# Patient Record
Sex: Male | Born: 2005 | Race: Black or African American | Hispanic: No | Marital: Single | Smoking: Never smoker
Health system: Southern US, Community
[De-identification: ages and names within clinical notes are randomized; demographics above are authoritative.]

## PROBLEM LIST (undated history)

## (undated) DIAGNOSIS — J45909 Unspecified asthma, uncomplicated: Secondary | ICD-10-CM

## (undated) DIAGNOSIS — D571 Sickle-cell disease without crisis: Secondary | ICD-10-CM

## (undated) HISTORY — PX: TONSILLECTOMY: SUR1361

## (undated) HISTORY — PX: CHOLECYSTECTOMY: SHX55

## (undated) HISTORY — PX: TYMPANOSTOMY TUBE PLACEMENT: SHX32

---
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
## 2024-01-08 ENCOUNTER — Inpatient Hospital Stay (HOSPITAL_COMMUNITY)
Admission: EM | Admit: 2024-01-08 | Discharge: 2024-01-16 | DRG: 812 | Disposition: A | Discharge status: Home / Self Care | Attending: Internal Medicine | Admitting: Internal Medicine

## 2024-01-08 ENCOUNTER — Other Ambulatory Visit: Payer: Self-pay

## 2024-01-08 DIAGNOSIS — R030 Elevated blood-pressure reading, without diagnosis of hypertension: Secondary | ICD-10-CM

## 2024-01-08 DIAGNOSIS — G894 Chronic pain syndrome: Secondary | ICD-10-CM | POA: Diagnosis present

## 2024-01-08 DIAGNOSIS — Z79899 Other long term (current) drug therapy: Secondary | ICD-10-CM

## 2024-01-08 DIAGNOSIS — H6691 Otitis media, unspecified, right ear: Secondary | ICD-10-CM | POA: Diagnosis present

## 2024-01-08 DIAGNOSIS — I10 Essential (primary) hypertension: Secondary | ICD-10-CM | POA: Diagnosis present

## 2024-01-08 DIAGNOSIS — F17290 Nicotine dependence, other tobacco product, uncomplicated: Secondary | ICD-10-CM | POA: Diagnosis present

## 2024-01-08 DIAGNOSIS — D638 Anemia in other chronic diseases classified elsewhere: Secondary | ICD-10-CM | POA: Diagnosis present

## 2024-01-08 DIAGNOSIS — Z720 Tobacco use: Secondary | ICD-10-CM | POA: Diagnosis present

## 2024-01-08 DIAGNOSIS — D5700 Hb-SS disease with crisis, unspecified: Principal | ICD-10-CM | POA: Diagnosis present

## 2024-01-08 DIAGNOSIS — H6690 Otitis media, unspecified, unspecified ear: Secondary | ICD-10-CM | POA: Insufficient documentation

## 2024-01-08 DIAGNOSIS — Z9049 Acquired absence of other specified parts of digestive tract: Secondary | ICD-10-CM

## 2024-01-08 DIAGNOSIS — Z716 Tobacco abuse counseling: Secondary | ICD-10-CM

## 2024-01-08 LAB — CBC WITH DIFFERENTIAL/PLATELET
Abs Immature Granulocytes: 0.38 K/uL — ABNORMAL HIGH (ref 0.00–0.07)
Basophils Absolute: 0.1 K/uL (ref 0.0–0.1)
Basophils Relative: 1 %
Eosinophils Absolute: 0 K/uL (ref 0.0–0.5)
Eosinophils Relative: 0 %
HCT: 29.1 % — ABNORMAL LOW (ref 39.0–52.0)
Hemoglobin: 10.5 g/dL — ABNORMAL LOW (ref 13.0–17.0)
Immature Granulocytes: 3 %
Lymphocytes Relative: 16 %
Lymphs Abs: 2 K/uL (ref 0.7–4.0)
MCH: 31.3 pg (ref 26.0–34.0)
MCHC: 36.1 g/dL — ABNORMAL HIGH (ref 30.0–36.0)
MCV: 86.9 fL (ref 80.0–100.0)
Monocytes Absolute: 1.2 K/uL — ABNORMAL HIGH (ref 0.1–1.0)
Monocytes Relative: 9 %
Neutro Abs: 9.1 K/uL — ABNORMAL HIGH (ref 1.7–7.7)
Neutrophils Relative %: 71 %
Platelets: 597 K/uL — ABNORMAL HIGH (ref 150–400)
RBC: 3.35 MIL/uL — ABNORMAL LOW (ref 4.22–5.81)
RDW: 19.9 % — ABNORMAL HIGH (ref 11.5–15.5)
WBC: 12.8 K/uL — ABNORMAL HIGH (ref 4.0–10.5)
nRBC: 0.9 % — ABNORMAL HIGH (ref 0.0–0.2)

## 2024-01-08 LAB — COMPREHENSIVE METABOLIC PANEL WITH GFR
ALT: 19 U/L (ref 0–44)
AST: 54 U/L — ABNORMAL HIGH (ref 15–41)
Albumin: 4.7 g/dL (ref 3.5–5.0)
Alkaline Phosphatase: 96 U/L (ref 38–126)
Anion gap: 14 (ref 5–15)
BUN: <5 mg/dL — ABNORMAL LOW (ref 6–20)
CO2: 19 mmol/L — ABNORMAL LOW (ref 22–32)
Calcium: 9.9 mg/dL (ref 8.9–10.3)
Chloride: 104 mmol/L (ref 98–111)
Creatinine, Ser: 0.6 mg/dL — ABNORMAL LOW (ref 0.61–1.24)
GFR, Estimated: >60 mL/min (ref >60)
Glucose, Bld: 112 mg/dL — ABNORMAL HIGH (ref 70–99)
Potassium: 4.3 mmol/L (ref 3.5–5.1)
Sodium: 137 mmol/L (ref 135–145)
Total Bilirubin: 3.5 mg/dL — ABNORMAL HIGH (ref 0.0–1.2)
Total Protein: 7.8 g/dL (ref 6.5–8.1)

## 2024-01-08 LAB — RETICULOCYTES
Immature Retic Fract: 14.8 % (ref 2.3–15.9)
RBC.: 3.6 MIL/uL — ABNORMAL LOW (ref 4.22–5.81)
Retic Count, Absolute: 484 K/uL — ABNORMAL HIGH (ref 19.0–186.0)
Retic Ct Pct: 13.5 % — ABNORMAL HIGH (ref 0.4–3.1)

## 2024-01-08 MED ORDER — ONDANSETRON HCL 4 MG/2ML IJ SOLN: 4.0000 mg | Freq: Four times a day (QID) | INTRAMUSCULAR | Status: DC | PRN

## 2024-01-08 MED ORDER — OXYCODONE HCL 5 MG PO TABS
10.0000 mg | ORAL_TABLET | Freq: Once | ORAL | Status: AC
  Administered 2024-01-08: 10 mg via ORAL
  Filled 2024-01-08: qty 2

## 2024-01-08 MED ORDER — SODIUM CHLORIDE 0.45 % IV SOLN: INTRAVENOUS | Status: DC

## 2024-01-08 MED ORDER — HYDROMORPHONE HCL 1 MG/ML IJ SOLN
1.0000 mg | INTRAMUSCULAR | Status: AC
  Administered 2024-01-08: 1 mg via INTRAVENOUS
  Filled 2024-01-08: qty 1

## 2024-01-08 MED ORDER — KETOROLAC TROMETHAMINE 15 MG/ML IJ SOLN
15.0000 mg | INTRAMUSCULAR | Status: AC
  Administered 2024-01-08: 15 mg via INTRAVENOUS
  Filled 2024-01-08: qty 1

## 2024-01-08 MED ORDER — SODIUM CHLORIDE 0.9% FLUSH: 9.0000 mL | INTRAVENOUS | Status: DC | PRN

## 2024-01-08 MED ORDER — POLYETHYLENE GLYCOL 3350 17 G PO PACK: 17.0000 g | PACK | Freq: Every day | ORAL | Status: DC | PRN

## 2024-01-08 MED ORDER — DIPHENHYDRAMINE HCL 50 MG/ML IJ SOLN: 12.5000 mg | Freq: Four times a day (QID) | INTRAMUSCULAR | Status: DC | PRN

## 2024-01-08 MED ORDER — NALOXONE HCL 0.4 MG/ML IJ SOLN
0.4000 mg | INTRAMUSCULAR | Status: DC | PRN
Start: 2024-01-08 — End: 2024-01-16

## 2024-01-08 MED ORDER — DEXTROSE-SODIUM CHLORIDE 5-0.45 % IV SOLN: INTRAVENOUS | Status: AC

## 2024-01-08 MED ORDER — DIPHENHYDRAMINE HCL 12.5 MG/5ML PO ELIX
12.5000 mg | ORAL_SOLUTION | Freq: Four times a day (QID) | ORAL | Status: DC | PRN
Start: 2024-01-08 — End: 2024-01-16

## 2024-01-08 MED ORDER — SENNOSIDES-DOCUSATE SODIUM 8.6-50 MG PO TABS
1.0000 | ORAL_TABLET | Freq: Two times a day (BID) | ORAL | Status: DC
  Administered 2024-01-08 – 2024-01-16 (×16): 1 via ORAL
  Filled 2024-01-08 (×16): qty 1

## 2024-01-08 MED ORDER — HYDROMORPHONE 1 MG/ML IV SOLN
INTRAVENOUS | Status: DC
  Administered 2024-01-09: 3 mg via INTRAVENOUS
  Administered 2024-01-09: 1.7 mg via INTRAVENOUS
  Administered 2024-01-09: 2.4 mg via INTRAVENOUS
  Administered 2024-01-09: 0.5 mg via INTRAVENOUS
  Administered 2024-01-09: 2.7 mg via INTRAVENOUS
  Administered 2024-01-09: 30 mg via INTRAVENOUS
  Administered 2024-01-09: 2.9 mg via INTRAVENOUS
  Administered 2024-01-10 (×2): 3.6 mg via INTRAVENOUS
  Administered 2024-01-10: 1.8 mg via INTRAVENOUS
  Administered 2024-01-10: 30 mg via INTRAVENOUS
  Administered 2024-01-11: 2.4 mg via INTRAVENOUS
  Administered 2024-01-11: 1.2 mg via INTRAVENOUS
  Administered 2024-01-11: 1.8 mg via INTRAVENOUS
  Administered 2024-01-11: 2.4 mg via INTRAVENOUS
  Administered 2024-01-12: 1.2 mg via INTRAVENOUS
  Administered 2024-01-12: 2.7 mg via INTRAVENOUS
  Administered 2024-01-12: 1.5 mg via INTRAVENOUS
  Administered 2024-01-13: 30 mg via INTRAVENOUS
  Administered 2024-01-14: 1.2 mg via INTRAVENOUS
  Administered 2024-01-14: 0.8 mg via INTRAVENOUS
  Administered 2024-01-14: 1.2 mg via INTRAVENOUS
  Administered 2024-01-14: 1.6 mg via INTRAVENOUS
  Administered 2024-01-15: 0.5 mg via INTRAVENOUS
  Administered 2024-01-15: 2.1 mg via INTRAVENOUS
  Administered 2024-01-15: 1.6 mg via INTRAVENOUS
  Administered 2024-01-15: 2.1 mg via INTRAVENOUS
  Administered 2024-01-15: 1.8 mg via INTRAVENOUS
  Administered 2024-01-15: 2 mg via INTRAVENOUS
  Administered 2024-01-16 (×2): 1 mg via INTRAVENOUS
  Administered 2024-01-16: 3 mg via INTRAVENOUS
  Administered 2024-01-16: 1.5 mg via INTRAVENOUS
  Administered 2024-01-16: 30 mg via INTRAVENOUS
  Filled 2024-01-08 (×4): qty 30

## 2024-01-08 MED ORDER — KETOROLAC TROMETHAMINE 15 MG/ML IJ SOLN
15.0000 mg | Freq: Four times a day (QID) | INTRAMUSCULAR | Status: AC
  Administered 2024-01-08 – 2024-01-13 (×20): 15 mg via INTRAVENOUS
  Filled 2024-01-08 (×20): qty 1

## 2024-01-08 MED ORDER — ONDANSETRON 4 MG PO TBDP
4.0000 mg | ORAL_TABLET | Freq: Once | ORAL | Status: AC
  Administered 2024-01-08: 4 mg via ORAL
  Filled 2024-01-08: qty 1

## 2024-01-08 NOTE — ED Triage Notes (Signed)
 Pt arrives to triage via wheelchair accompanied by grandmother. PT has complaints of sickle cell pain that is worse in both legs. Pt states that he has been out of hydroxyurea  for approx 1 week.   Pt tearful, and appears to be uncomfortable during triage.

## 2024-01-08 NOTE — Assessment & Plan Note (Signed)
 Counseled on cessation

## 2024-01-08 NOTE — Assessment & Plan Note (Addendum)
 18 year old male with history of sickle cell anemia who presented to ED with complaints of leg pain, increased reticulocyte count concerning for sickle cell crisis  -obs to progressive for sickle cell crisis -sickle cell protocol ordered -D51/2 NS at 100cc/hour -toradol   -reduced dose PCA (not on chronic opioids) -CXR pending, denies any chest pain or shortness of breath  -reticulocytes elevated, hgb stable -transfuse if hgb <7  -start back hydroxyurea  and folic acid   -sickle cell team to take over in AM

## 2024-01-08 NOTE — H&P (Signed)
 History and Physical    Patient: Rick Jefferson FMW:[id number] DOB: 07/12/2005 DOA: 01/08/2024 DOS: the patient was seen and examined on 01/08/2024 PCP: Pcp, No  Patient coming from: Home - lives with his grandmother and uncle.    Chief Complaint: leg pain. Concern for sickle cell crisis   HPI: Rick Jefferson is a 18 y.o. male with medical history significant of asthma and sickle cell disease who presented to ED with complaints of bilateral leg pain.  He has been out of his hydroxyurea  x 1 week. He states today he started to get pain in his left lower leg which is typical, but also had pain in his right arm and neck. He had a cold last week, but no other illness and no fevers. He has no shortness of breath or chest pain. Denies any cough. He is not on chronic pain meds, only as needed for pain crisis.    He has been feeling good. Denies any fever/chills, vision changes/headaches, chest pain or palpitations, shortness of breath or cough, abdominal pain, N/V/D, dysuria or leg swelling.   He vapes, no alcohol.   ER Course:  vitals: afebrile, bp: 142/82, HR: 66, RR: 22, oxygen: 100%RA Pertinent labs: wbc: 12.8, hct: 10.5, platelets 597, t.bili: 3.5, AST: 54, reticulocytes: 13.5 CXR: pending In Ed:    Review of Systems: As mentioned in the history of present illness. All other systems reviewed and are negative. Past Medical History:  Diagnosis Date   Asthma    Sickle cell anemia (HCC)    Past Surgical History:  Procedure Laterality Date   CHOLECYSTECTOMY     TONSILLECTOMY     TYMPANOSTOMY TUBE PLACEMENT     Social History:  reports that he has never smoked. He has been exposed to tobacco smoke. He has never used smokeless tobacco. He reports that he does not drink alcohol and does not use drugs.  No Known Allergies  No family history on file.  Prior to Admission medications   Medication Sig Start Date End Date Taking? Authorizing Provider  Acetaminophen  (TYLENOL ) 325 MG  CAPS Take 650 mg by mouth every 6 (six) hours as needed (pain). Patient taking differently: Take 650 mg by mouth daily as needed (pain). 07/16/22   Dalkin, William A, MD  folic acid  (FOLVITE ) 1 MG tablet Take 1 mg by mouth daily. 08/03/23 08/02/24  [provider]  hydroxyurea  (HYDREA ) 500 MG capsule Take 1,500 mg by mouth daily. 09/17/21   [provider]  ibuprofen  (ADVIL ) 600 MG tablet Take 1 tablet (600 mg total) by mouth every 6 (six) hours as needed. Patient taking differently: Take 600 mg by mouth daily as needed for mild pain (pain score 1-3) or moderate pain (pain score 4-6). 04/20/22   Hulsman, Donnice PARAS, NP  morphine  (MS CONTIN ) 30 MG 12 hr tablet Take 1 tablet (30 mg total) by mouth every 12 (twelve) hours. Patient not taking: Reported on 09/07/2023 07/23/22   Elicia Hamlet, MD  oxyCODONE  (OXY IR/ROXICODONE ) 5 MG immediate release tablet Take 5 mg by mouth daily as needed for severe pain (pain score 7-10) or moderate pain (pain score 4-6).    [provider]  polyethylene glycol (MIRALAX  / GLYCOLAX ) 17 g packet Take 17 g by mouth 2 (two) times daily. Patient taking differently: Take 17 g by mouth daily as needed for moderate constipation. 08/25/20   Arletha High, MD    Physical Exam: Vitals:   01/08/24 1845 01/08/24 2319  BP: (!) 142/82 (!) 143/76  Pulse: 66 79  Resp: (!) 22 19  Temp: 98.2 F (36.8 C) 97.9 F (36.6 C)  TempSrc:  Oral  SpO2: 100% 95%   General:  Appears calm and in pain, but NAD  Eyes:  PERRL, EOMI, normal lids, iris ENT:  grossly normal hearing, lips & tongue, mmm; appropriate dentition Neck:  no LAD, masses or thyromegaly; no carotid bruits Cardiovascular:  RRR, no m/r/g. No LE edema.  Respiratory:   CTA bilaterally with no wheezes/rales/rhonchi.  Normal respiratory effort. Abdomen:  soft, NT, ND, NABS Back:   normal alignment, no CVAT Skin:  no rash or induration seen on limited exam Musculoskeletal:  grossly normal tone BUE/BLE, good  ROM, no bony abnormality Lower extremity:  No LE edema.  Limited foot exam with no ulcerations.  2+ distal pulses. Psychiatric:  grossly normal mood and affect, speech fluent and appropriate, AOx3 Neurologic:  CN 2-12 grossly intact, moves all extremities in coordinated fashion, sensation intact   Radiological Exams on Admission: Independently reviewed - see discussion in A/P where applicable  No results found.  EKG: Independently reviewed.  NSR with rate 56; nonspecific ST changes with no evidence of acute ischemia, PAC.?LVH    Labs on Admission: I have personally reviewed the available labs and imaging studies at the time of the admission.  Pertinent labs:   wbc: 12.8,  hct: 10.5,  platelets 597,  t.bili: 3.5,  AST: 54,  reticulocytes: 13.5  Assessment and Plan: Principal Problem:   Sickle cell pain crisis Active Problems:   Elevated blood pressure reading   Tobacco abuse    Assessment and Plan: * Sickle cell pain crisis 18 year old male with history of sickle cell anemia who presented to ED with complaints of leg pain, increased reticulocyte count concerning for sickle cell crisis  -obs to progressive for sickle cell crisis -sickle cell protocol ordered -D51/2 NS at 100cc/hour -toradol   -reduced dose PCA (not on chronic opioids) -CXR pending, denies any chest pain or shortness of breath  -reticulocytes elevated, hgb stable -transfuse if hgb <7  -start back hydroxyurea  and folic acid   -sickle cell team to take over in AM  Elevated blood pressure reading Mild elevated readings and in pain.  Monitor closely   Tobacco abuse Counseled on cessation      Advance Care Planning:   Code Status: Full Code   Consultants: None    Procedures: None    Antibiotics: None     DVT Prophylaxis: SCDs   Family Communication: grandmother at bedside   Severity of Illness: The appropriate patient status for this patient is OBSERVATION. Observation status is judged  to be reasonable and necessary in order to provide the required intensity of service to ensure the patient's safety. The patient's presenting symptoms, physical exam findings, and initial radiographic and laboratory data in the context of their medical condition is felt to place them at decreased risk for further clinical deterioration. Furthermore, it is anticipated that the patient will be medically stable for discharge from the hospital within 2 midnights of admission.   Author: Isaiah Geralds, MD 01/08/2024 11:55 PM  For on call review [email or web address].

## 2024-01-08 NOTE — Assessment & Plan Note (Signed)
 Mild elevated readings and in pain.  Monitor closely

## 2024-01-08 NOTE — ED Provider Triage Note (Signed)
 Emergency Medicine Provider Triage Evaluation Note  Rick Jefferson , a 18 y.o. male  was evaluated in triage.  Pt complains of bilateral leg pain in setting of sickle cell anemia.  Symptoms worsened today, cannot go to work, pain became severe.  He has not had medicine over the past 4 to 5 days.  Typically takes Tylenol , Toradol , oxycodone  but only as needed.  No fevers, chest pain, shortness of breath.  No swelling of the lower extremities or knees.  Review of Systems  Positive: Leg pain Negative: Fever  Physical Exam  BP (!) 142/82   Pulse 66   Temp 98.2 F (36.8 C)   Resp (!) 22   SpO2 100%  Gen:   Awake, tearful, appears uncomfortable Resp:  Normal effort  MSK:   Moves extremities without difficulty  Other:  No obvious edema of the upper legs.  No joint swelling noted.  Medical Decision Making  Medically screening exam initiated at 7:19 PM.  Appropriate orders placed.  Rick Jefferson was informed that the remainder of the evaluation will be completed by another provider, this initial triage assessment does not replace that evaluation, and the importance of remaining in the ED until their evaluation is complete.     Rick Chew, PA-C 01/08/24 1920

## 2024-01-08 NOTE — ED Provider Notes (Signed)
  EMERGENCY DEPARTMENT AT [facility] Provider Note   CSN: [id number] Arrival date & time: 01/08/24  8171     Patient presents with: No chief complaint on file.   Rick Jefferson is a 18 y.o. male.   HPI     18 year old patient comes in with chief complaint of sickle cell related pain.  Patient's pain is generalized, but primarily present over both of the legs.  Patient has been out of his hydroxyurea  for 1 week.  He states that this pain started this morning and has no specific trigger for it.  Pain is severe, and he has taken Tylenol , oxycodone  without any relief.  Patient only takes narcotic medicine when the pain is severe.  Grandmother is at the bedside and provides collateral history.  Patient's last admission to the hospital was in July for sickle cell.  Prior to Admission medications   Medication Sig Start Date End Date Taking? Authorizing Provider  Acetaminophen  (TYLENOL ) 325 MG CAPS Take 650 mg by mouth every 6 (six) hours as needed (pain). Patient taking differently: Take 650 mg by mouth daily as needed (pain). 07/16/22   Dalkin, William A, MD  folic acid  (FOLVITE ) 1 MG tablet Take 1 mg by mouth daily. 08/03/23 08/02/24  [provider]  hydroxyurea  (HYDREA ) 500 MG capsule Take 1,500 mg by mouth daily. 09/17/21   [provider]  ibuprofen  (ADVIL ) 600 MG tablet Take 1 tablet (600 mg total) by mouth every 6 (six) hours as needed. Patient taking differently: Take 600 mg by mouth daily as needed for mild pain (pain score 1-3) or moderate pain (pain score 4-6). 04/20/22   Hulsman, Matthew J, NP  morphine  (MS CONTIN ) 30 MG 12 hr tablet Take 1 tablet (30 mg total) by mouth every 12 (twelve) hours. Patient not taking: Reported on 09/07/2023 07/23/22   Elicia Hamlet, MD  oxyCODONE  (OXY IR/ROXICODONE ) 5 MG immediate release tablet Take 5 mg by mouth daily as needed for severe pain (pain score 7-10) or moderate pain (pain score 4-6).    [provider]  polyethylene glycol (MIRALAX  / GLYCOLAX ) 17 g packet Take 17 g by mouth 2 (two) times daily. Patient taking differently: Take 17 g by mouth daily as needed for moderate constipation. 08/25/20   Arletha High, MD    Allergies: Patient has no known allergies.    Review of Systems  All other systems reviewed and are negative.   Updated Vital Signs BP (!) 142/82   Pulse 66   Temp 98.2 F (36.8 C)   Resp (!) 22   SpO2 100%   Physical Exam Vitals and nursing note reviewed.  Constitutional:      General: He is in acute distress.     Appearance: He is well-developed. He is ill-appearing. He is not toxic-appearing.  HENT:     Head: Atraumatic.  Cardiovascular:     Rate and Rhythm: Normal rate.  Pulmonary:     Effort: Pulmonary effort is normal.  Musculoskeletal:     Cervical back: Neck supple.  Skin:    General: Skin is warm.  Neurological:     Mental Status: He is alert and oriented to person, place, and time.     (all labs ordered are listed, but only abnormal results are displayed) Labs Reviewed  CBC WITH DIFFERENTIAL/PLATELET - Abnormal; Notable for the following components:      Result Value   WBC 12.8 (*)    RBC 3.35 (*)    Hemoglobin  10.5 (*)    HCT 29.1 (*)    MCHC 36.1 (*)    RDW 19.9 (*)    Platelets 597 (*)    nRBC 0.9 (*)    All other components within normal limits  COMPREHENSIVE METABOLIC PANEL WITH GFR - Abnormal; Notable for the following components:   CO2 19 (*)    Glucose, Bld 112 (*)    BUN <5 (*)    Creatinine, Ser 0.60 (*)    AST 54 (*)    Total Bilirubin 3.5 (*)    All other components within normal limits  RETICULOCYTES - Abnormal; Notable for the following components:   Retic Ct Pct 13.5 (*)    RBC. 3.60 (*)    Retic Count, Absolute 484.0 (*)    All other components within normal limits    EKG: None  Radiology: No results found.   Procedures   Medications Ordered in the ED  0.45 % sodium chloride  infusion  (has no administration in time range)  HYDROmorphone  (DILAUDID ) injection 1 mg (has no administration in time range)  HYDROmorphone  (DILAUDID ) injection 1 mg (has no administration in time range)  oxyCODONE  (Oxy IR/ROXICODONE ) immediate release tablet 10 mg (10 mg Oral Given 01/08/24 2000)  ondansetron  (ZOFRAN -ODT) disintegrating tablet 4 mg (4 mg Oral Given 01/08/24 2001)  ketorolac  (TORADOL ) 15 MG/ML injection 15 mg (15 mg Intravenous Given 01/08/24 2124)  HYDROmorphone  (DILAUDID ) injection 1 mg (1 mg Intravenous Given 01/08/24 2125)                                    Medical Decision Making Risk Prescription drug management. Decision regarding hospitalization.   Pt comes in with sickle cell related pain.  Patient has history of sickle cell anemia with complications from it including vaso-occlusive crisis in the past. We have reviewed patient's previous ED visits, recent lab results and recent imaging results for this encounter.  Differential diagnosis includes vaso-occlusive pain, occult infection, dehydration, electrolyte abnormality, hyperbilirubinemia, aplastic anemia.  VSS and WNL -  hemodynamically stable.  History and exam is not indicative of any specific source of infection at this time.  Pain appears vaso-occlusive type and typical of previous pain. Appropriate labs ordered.  Pain control and symptom management initiated while in the ED with parenteral medication. We will reassess patient after multiple doses of analgesics. Goal is to break the pain and see if patient feels comfortable going home.  Currently, there is no signs of severe decompensation clinically. Will continue to monitor closely.  Admission has been considered for this patient, and we will proceed with this request If we are unable to control the pain, we will admit the patient.   11:27 PM Continues to have pain per nursing staff.  Will request admission.  Labs are otherwise reassuring.  Final  diagnoses:  Sickle cell pain crisis [name])    ED Discharge Orders     None          Charlyn Sora, MD 01/08/24 2327

## 2024-01-09 ENCOUNTER — Observation Stay (HOSPITAL_COMMUNITY)

## 2024-01-09 ENCOUNTER — Other Ambulatory Visit: Payer: Self-pay

## 2024-01-09 ENCOUNTER — Encounter (HOSPITAL_COMMUNITY): Payer: Self-pay | Admitting: Family Medicine

## 2024-01-09 DIAGNOSIS — D5700 Hb-SS disease with crisis, unspecified: Secondary | ICD-10-CM

## 2024-01-09 LAB — CBC WITH DIFFERENTIAL/PLATELET
Abs Immature Granulocytes: 0.31 K/uL — ABNORMAL HIGH (ref 0.00–0.07)
Basophils Absolute: 0.1 K/uL (ref 0.0–0.1)
Basophils Relative: 1 %
Eosinophils Absolute: 0 K/uL (ref 0.0–0.5)
Eosinophils Relative: 0 %
HCT: 25.8 % — ABNORMAL LOW (ref 39.0–52.0)
Hemoglobin: 9.2 g/dL — ABNORMAL LOW (ref 13.0–17.0)
Immature Granulocytes: 2 %
Lymphocytes Relative: 26 %
Lymphs Abs: 3.9 K/uL (ref 0.7–4.0)
MCH: 31.6 pg (ref 26.0–34.0)
MCHC: 35.7 g/dL (ref 30.0–36.0)
MCV: 88.7 fL (ref 80.0–100.0)
Monocytes Absolute: 1.7 K/uL — ABNORMAL HIGH (ref 0.1–1.0)
Monocytes Relative: 11 %
Neutro Abs: 8.7 K/uL — ABNORMAL HIGH (ref 1.7–7.7)
Neutrophils Relative %: 60 %
Platelets: 501 K/uL — ABNORMAL HIGH (ref 150–400)
RBC: 2.91 MIL/uL — ABNORMAL LOW (ref 4.22–5.81)
RDW: 20.6 % — ABNORMAL HIGH (ref 11.5–15.5)
WBC: 14.6 K/uL — ABNORMAL HIGH (ref 4.0–10.5)
nRBC: 1.2 % — ABNORMAL HIGH (ref 0.0–0.2)

## 2024-01-09 LAB — URINE DRUG SCREEN
Amphetamines: NEGATIVE
Barbiturates: NEGATIVE
Benzodiazepines: NEGATIVE
Cocaine: NEGATIVE
Fentanyl: NEGATIVE
Methadone Scn, Ur: NEGATIVE
Opiates: NEGATIVE
Tetrahydrocannabinol: POSITIVE — AB

## 2024-01-09 LAB — COMPREHENSIVE METABOLIC PANEL WITH GFR
ALT: 17 U/L (ref 0–44)
AST: 48 U/L — ABNORMAL HIGH (ref 15–41)
Albumin: 4.5 g/dL (ref 3.5–5.0)
Alkaline Phosphatase: 90 U/L (ref 38–126)
Anion gap: 12 (ref 5–15)
BUN: <5 mg/dL — ABNORMAL LOW (ref 6–20)
CO2: 22 mmol/L (ref 22–32)
Calcium: 9.2 mg/dL (ref 8.9–10.3)
Chloride: 105 mmol/L (ref 98–111)
Creatinine, Ser: 0.53 mg/dL — ABNORMAL LOW (ref 0.61–1.24)
GFR, Estimated: >60 mL/min (ref >60)
Glucose, Bld: 96 mg/dL (ref 70–99)
Potassium: 4.3 mmol/L (ref 3.5–5.1)
Sodium: 140 mmol/L (ref 135–145)
Total Bilirubin: 3.9 mg/dL — ABNORMAL HIGH (ref 0.0–1.2)
Total Protein: 7.3 g/dL (ref 6.5–8.1)

## 2024-01-09 LAB — RETICULOCYTES
Immature Retic Fract: 15.6 % (ref 2.3–15.9)
RBC.: 3.2 MIL/uL — ABNORMAL LOW (ref 4.22–5.81)
Retic Count, Absolute: 484 K/uL — ABNORMAL HIGH (ref 19.0–186.0)
Retic Ct Pct: 15.1 % — ABNORMAL HIGH (ref 0.4–3.1)

## 2024-01-09 MED ORDER — HYDROMORPHONE HCL 1 MG/ML IJ SOLN
1.0000 mg | INTRAMUSCULAR | Status: DC | PRN
Start: 2024-01-09 — End: 2024-01-09

## 2024-01-09 MED ORDER — HYDROXYUREA 500 MG PO CAPS
1500.0000 mg | ORAL_CAPSULE | Freq: Every day | ORAL | Status: DC
  Administered 2024-01-09 – 2024-01-16 (×8): 1500 mg via ORAL
  Filled 2024-01-09 (×8): qty 3

## 2024-01-09 MED ORDER — LIDOCAINE 5 % EX PTCH
1.0000 | MEDICATED_PATCH | Freq: Every day | CUTANEOUS | Status: DC
  Administered 2024-01-09 – 2024-01-15 (×7): 1 via TRANSDERMAL
  Filled 2024-01-09 (×7): qty 1

## 2024-01-09 MED ORDER — FOLIC ACID 1 MG PO TABS
1.0000 mg | ORAL_TABLET | Freq: Every day | ORAL | Status: DC
  Administered 2024-01-09 – 2024-01-16 (×8): 1 mg via ORAL
  Filled 2024-01-09 (×8): qty 1

## 2024-01-09 MED ORDER — HYDROMORPHONE HCL 1 MG/ML IJ SOLN
1.0000 mg | INTRAMUSCULAR | Status: AC
  Administered 2024-01-09: 1 mg via INTRAVENOUS
  Filled 2024-01-09: qty 1

## 2024-01-09 NOTE — Plan of Care (Incomplete)
  Problem: Education: Goal: Knowledge of vaso-occlusive preventative measures will improve Outcome: Progressing Goal: Awareness of infection prevention will improve Outcome: Progressing   

## 2024-01-09 NOTE — Plan of Care (Signed)

## 2024-01-09 NOTE — Progress Notes (Signed)
 Patient ID: Rick Jefferson, male   DOB: 2005-05-01, 18 y.o.   MRN: [id number] Subjective: Rick Jefferson is a 18 y.o. male with medical history significant of asthma and sickle cell disease who presented to ED with complaints of bilateral leg pain.  He was subsequently admitted for sickle cell pain crisis.  Patient continues to endorse significant pain especially in his right shoulder and lower extremities as well as left lower back and neck.  He said his pain is at 8/10 this morning.  He characterizes pain as throbbing and achy.  Good appetite.  He denies any fever, cough, chest pain, shortness of breath, nausea, vomiting or diarrhea.  No urinary symptoms.  Objective:  Vital signs in last 24 hours:  Vitals:   01/09/24 0800 01/09/24 1017 01/09/24 1136 01/09/24 1443  BP:  129/66  (!) 145/85  Pulse:  60  80  Resp: 19  18   Temp:  98 F (36.7 C)  97.7 F (36.5 C)  TempSrc:  Oral  Oral  SpO2:  99%  94%  Weight:      Height:        Intake/Output from previous day:   Intake/Output Summary (Last 24 hours) at 01/09/2024 1526 Last data filed at 01/09/2024 1339 Gross per 24 hour  Intake 1714.22 ml  Output --  Net 1714.22 ml    Physical Exam: General: Alert, awake, oriented x3, in no acute distress.  HEENT: [place]/AT PEERL, EOMI Neck: Trachea midline,  no masses, no thyromegal,y no JVD, no carotid bruit OROPHARYNX:  Moist, No exudate/ erythema/lesions.  Heart: Regular rate and rhythm, without murmurs, rubs, gallops, PMI non-displaced, no heaves or thrills on palpation.  Lungs: Clear to auscultation, no wheezing or rhonchi noted. No increased vocal fremitus resonant to percussion  Abdomen: Soft, nontender, nondistended, positive bowel sounds, no masses no hepatosplenomegaly noted..  Neuro: No focal neurological deficits noted cranial nerves II through XII grossly intact. DTRs 2+ bilaterally upper and lower extremities. Strength 5 out of 5 in bilateral upper and lower  extremities. Musculoskeletal: No warm swelling or erythema around joints, no spinal tenderness noted. Psychiatric: Patient alert and oriented x3, good insight and cognition, good recent to remote recall. Lymph node survey: No cervical axillary or inguinal lymphadenopathy noted.  Lab Results:  Basic Metabolic Panel:    Component Value Date/Time   NA 140 01/09/2024 0419   K 4.3 01/09/2024 0419   CL 105 01/09/2024 0419   CO2 22 01/09/2024 0419   BUN <5 (L) 01/09/2024 0419   CREATININE 0.53 (L) 01/09/2024 0419   GLUCOSE 96 01/09/2024 0419   CALCIUM  9.2 01/09/2024 0419   CBC:    Component Value Date/Time   WBC 14.6 (H) 01/09/2024 0419   HGB 9.2 (L) 01/09/2024 0419   HCT 25.8 (L) 01/09/2024 0419   PLT 501 (H) 01/09/2024 0419   MCV 88.7 01/09/2024 0419   NEUTROABS 8.7 (H) 01/09/2024 0419   LYMPHSABS 3.9 01/09/2024 0419   MONOABS 1.7 (H) 01/09/2024 0419   EOSABS 0.0 01/09/2024 0419   BASOSABS 0.1 01/09/2024 0419    No results found for this or any previous visit (from the past 240 hours).  Studies/Results: DG Chest 2 View Result Date: 01/09/2024 EXAM: 2 VIEW(S) XRAY OF THE CHEST 01/09/2024 12:39:06 AM COMPARISON: 09/06/2023 CLINICAL HISTORY: Sickle cell crisis (HCC) [id number] FINDINGS: LUNGS AND PLEURA: No focal pulmonary opacity. No pleural effusion. No pneumothorax. HEART AND MEDIASTINUM: No acute abnormality of the cardiac and mediastinal silhouettes. BONES AND SOFT TISSUES: No acute  osseous abnormality. IMPRESSION: 1. No acute cardiopulmonary process. Electronically signed by: Oneil Devonshire MD 01/09/2024 12:41 AM EST RP Workstation: [id number]    Medications: Scheduled Meds:  folic acid   1 mg Oral Daily   HYDROmorphone    Intravenous Q4H   hydroxyurea   1,500 mg Oral Daily   ketorolac   15 mg Intravenous Q6H   lidocaine   1 patch Transdermal Daily   senna-docusate  1 tablet Oral BID   Continuous Infusions:  dextrose  5 % and 0.45 % NaCl 75 mL/hr at 01/09/24 1451   PRN  Meds:.diphenhydrAMINE  **OR** diphenhydrAMINE , naloxone  **AND** sodium chloride  flush, ondansetron  (ZOFRAN ) IV  Consultants: None  Procedures: None  Antibiotics: None  Assessment/Plan: Principal Problem:   Sickle cell pain crisis Active Problems:   Tobacco abuse   Elevated blood pressure reading  Hb Sickle Cell Disease with Pain crisis: Reduce IVF to 75 mls/hour, continue weight based Dilaudid  PCA, continue IV Toradol  15 mg Q 6 H for a total of 5 days, will restart and continue oral home pain medications. Monitor vitals very closely, Re-evaluate pain scale regularly, 2 L of Oxygen by [place]. Patient encouraged to ambulate on the hallway today.  Leukocytosis: Most likely reactive to VOC. There is no symptoms or signs of infection or inflammation. Will continue to monitor closely without antibiotics at this time. Anemia of Chronic Disease: Hemoglobin is stable at baseline today.  There is no clinical indication for blood transfusion at this time.  Will continue to monitor closely and transfuse as appropriate. Chronic pain Syndrome: Restart and continue oral home pain medications. Elevated blood pressure: Most likely due to pain.  Will treat pain and monitor blood pressure very closely. Tobacco use disorder: [place] was counseled on the dangers of tobacco use, and was advised to quit. Reviewed strategies to maximize success, including removing cigarettes and smoking materials from environment, stress management and support of family/friends.   Code Status: Full Code Family Communication: N/A Disposition Plan: Not yet ready for discharge  Rick Jefferson  If 7PM-7AM, please contact night-coverage.  01/09/2024, 3:26 PM  LOS: 0 days

## 2024-01-10 ENCOUNTER — Inpatient Hospital Stay (HOSPITAL_COMMUNITY)

## 2024-01-10 DIAGNOSIS — D5700 Hb-SS disease with crisis, unspecified: Secondary | ICD-10-CM | POA: Diagnosis not present

## 2024-01-10 LAB — URINALYSIS, ROUTINE W REFLEX MICROSCOPIC
Bilirubin Urine: NEGATIVE
Glucose, UA: NEGATIVE mg/dL
Hgb urine dipstick: NEGATIVE
Ketones, ur: NEGATIVE mg/dL
Leukocytes,Ua: NEGATIVE
Nitrite: NEGATIVE
Protein, ur: NEGATIVE mg/dL
Specific Gravity, Urine: 1.032 — ABNORMAL HIGH (ref 1.005–1.030)
pH: 6 (ref 5.0–8.0)

## 2024-01-10 LAB — CBC
HCT: 26.4 % — ABNORMAL LOW (ref 39.0–52.0)
Hemoglobin: 9.5 g/dL — ABNORMAL LOW (ref 13.0–17.0)
MCH: 31.4 pg (ref 26.0–34.0)
MCHC: 36 g/dL (ref 30.0–36.0)
MCV: 87.1 fL (ref 80.0–100.0)
Platelets: 477 K/uL — ABNORMAL HIGH (ref 150–400)
RBC: 3.03 MIL/uL — ABNORMAL LOW (ref 4.22–5.81)
RDW: 20.9 % — ABNORMAL HIGH (ref 11.5–15.5)
WBC: 15.4 K/uL — ABNORMAL HIGH (ref 4.0–10.5)
nRBC: 2.1 % — ABNORMAL HIGH (ref 0.0–0.2)

## 2024-01-10 LAB — LACTATE DEHYDROGENASE: LDH: 838 U/L — ABNORMAL HIGH (ref 105–235)

## 2024-01-10 MED ORDER — OXYCODONE HCL 5 MG PO TABS
5.0000 mg | ORAL_TABLET | Freq: Every day | ORAL | Status: DC | PRN
Start: 2024-01-10 — End: 2024-01-16
  Administered 2024-01-16: 5 mg via ORAL
  Filled 2024-01-10: qty 1

## 2024-01-10 MED ORDER — IOHEXOL 350 MG/ML SOLN
80.0000 mL | Freq: Once | INTRAVENOUS | Status: AC | PRN
  Administered 2024-01-10: 75 mL via INTRAVENOUS

## 2024-01-10 MED ORDER — ACETAMINOPHEN 325 MG PO TABS
650.0000 mg | ORAL_TABLET | Freq: Four times a day (QID) | ORAL | Status: DC | PRN
  Administered 2024-01-10 – 2024-01-14 (×3): 650 mg via ORAL
  Filled 2024-01-10 (×3): qty 2

## 2024-01-10 MED ORDER — MORPHINE SULFATE ER 30 MG PO TBCR
30.0000 mg | EXTENDED_RELEASE_TABLET | Freq: Two times a day (BID) | ORAL | Status: DC
Start: 2024-01-10 — End: 2024-01-16
  Administered 2024-01-10 – 2024-01-16 (×13): 30 mg via ORAL
  Filled 2024-01-10 (×13): qty 1

## 2024-01-10 NOTE — Progress Notes (Signed)
 Patient ID: Rick Jefferson, male   DOB: 2005/04/05, 18 y.o.   MRN: [id number] Subjective: Rick Jefferson is a 18 y.o. male with medical history significant of asthma and sickle cell disease who presented to ED with complaints of bilateral leg pain.  He was subsequently admitted for sickle cell pain crisis.  Patient endorses some improvement in his pain but still has significant pain in his right shoulder.  He says his pain is down to 7/10 this morning.  He was reported to have low-grade fever of 100.9 F early this morning but has not persisted.  Patient has no cough or chest pain.  He has no sick contact or recent travels.  He denies any nausea, vomiting or diarrhea.  No urinary symptoms.  Objective:  Vital signs in last 24 hours:  Vitals:   01/09/24 2327 01/10/24 0439 01/10/24 0547 01/10/24 0728  BP:   (!) 140/85   Pulse:   94   Resp: (!) 27 (!) 24 18 20   Temp:   (!) 100.9 F (38.3 C)   TempSrc:   Oral   SpO2:   91% 94%  Weight:      Height:        Intake/Output from previous day:   Intake/Output Summary (Last 24 hours) at 01/10/2024 1115 Last data filed at 01/10/2024 0741 Gross per 24 hour  Intake 1660.48 ml  Output --  Net 1660.48 ml    Physical Exam: General: Alert, awake, oriented x3, in no acute distress.  HEENT: [place]/AT PEERL, EOMI Neck: Trachea midline,  no masses, no thyromegal,y no JVD, no carotid bruit OROPHARYNX:  Moist, No exudate/ erythema/lesions.  Heart: Regular rate and rhythm, without murmurs, rubs, gallops, PMI non-displaced, no heaves or thrills on palpation.  Lungs: Clear to auscultation, no wheezing or rhonchi noted. No increased vocal fremitus resonant to percussion  Abdomen: Soft, nontender, nondistended, positive bowel sounds, no masses no hepatosplenomegaly noted..  Neuro: No focal neurological deficits noted cranial nerves II through XII grossly intact. DTRs 2+ bilaterally upper and lower extremities. Strength 5 out of 5 in bilateral upper and  lower extremities. Musculoskeletal: No warm swelling or erythema around joints, no spinal tenderness noted. Psychiatric: Patient alert and oriented x3, good insight and cognition, good recent to remote recall. Lymph node survey: No cervical axillary or inguinal lymphadenopathy noted.  Lab Results:  Basic Metabolic Panel:    Component Value Date/Time   NA 140 01/09/2024 0419   K 4.3 01/09/2024 0419   CL 105 01/09/2024 0419   CO2 22 01/09/2024 0419   BUN <5 (L) 01/09/2024 0419   CREATININE 0.53 (L) 01/09/2024 0419   GLUCOSE 96 01/09/2024 0419   CALCIUM  9.2 01/09/2024 0419   CBC:    Component Value Date/Time   WBC 14.6 (H) 01/09/2024 0419   HGB 9.2 (L) 01/09/2024 0419   HCT 25.8 (L) 01/09/2024 0419   PLT 501 (H) 01/09/2024 0419   MCV 88.7 01/09/2024 0419   NEUTROABS 8.7 (H) 01/09/2024 0419   LYMPHSABS 3.9 01/09/2024 0419   MONOABS 1.7 (H) 01/09/2024 0419   EOSABS 0.0 01/09/2024 0419   BASOSABS 0.1 01/09/2024 0419    No results found for this or any previous visit (from the past 240 hours).  Studies/Results: DG Chest 2 View Result Date: 01/09/2024 EXAM: 2 VIEW(S) XRAY OF THE CHEST 01/09/2024 12:39:06 AM COMPARISON: 09/06/2023 CLINICAL HISTORY: Sickle cell crisis (HCC) [id number] FINDINGS: LUNGS AND PLEURA: No focal pulmonary opacity. No pleural effusion. No pneumothorax. HEART AND MEDIASTINUM: No acute abnormality  of the cardiac and mediastinal silhouettes. BONES AND SOFT TISSUES: No acute osseous abnormality. IMPRESSION: 1. No acute cardiopulmonary process. Electronically signed by: Oneil Devonshire MD 01/09/2024 12:41 AM EST RP Workstation: [id number]    Medications: Scheduled Meds:  folic acid   1 mg Oral Daily   HYDROmorphone    Intravenous Q4H   hydroxyurea   1,500 mg Oral Daily   ketorolac   15 mg Intravenous Q6H   lidocaine   1 patch Transdermal Daily   senna-docusate  1 tablet Oral BID   Continuous Infusions:   PRN Meds:.diphenhydrAMINE  **OR** diphenhydrAMINE , naloxone   **AND** sodium chloride  flush, ondansetron  (ZOFRAN ) IV  Consultants: None  Procedures: None  Antibiotics: None  Assessment/Plan: Principal Problem:   Sickle cell pain crisis Active Problems:   Tobacco abuse   Elevated blood pressure reading  Hb Sickle Cell Disease with Pain crisis: Reduce IVF to KVO, continue weight based Dilaudid  PCA at current dose setting, continue IV Toradol  15 mg Q 6 H for a total of 5 days, will restart and continue oral home pain medications.  Add acetaminophen  as needed for fever.  Monitor vitals very closely, Re-evaluate pain scale regularly, 2 L of Oxygen by [place]. Patient encouraged to ambulate on the hallway today.  Leukocytosis: Most likely reactive to VOC.  Patient had low-grade fever this morning, 100.9 F.  Will continue to monitor closely, will consider empiric antibiotics if fever persist.  Blood cultures. Anemia of Chronic Disease: Hemoglobin remains stable at baseline.  There is no clinical indication for blood transfusion at this time. Will continue to monitor closely and transfuse as appropriate. Chronic pain Syndrome: Continue oral home pain medications as ordered. Elevated blood pressure: On and off.  Most likely due to pain.  Will continue to maximize pain control and monitor blood pressure very closely. Tobacco use disorder: Rick Jefferson was counseled on the dangers of tobacco use, and was advised to quit. Reviewed strategies to maximize success, including removing cigarettes and smoking materials from environment, stress management and support of family/friends.   Code Status: Full Code Family Communication: N/A Disposition Plan: Not yet ready for discharge  Rick Jefferson  If 7PM-7AM, please contact night-coverage.  01/10/2024, 11:15 AM  LOS: 1 day

## 2024-01-10 NOTE — Plan of Care (Signed)
 Inpatient education progressing

## 2024-01-11 LAB — COMPREHENSIVE METABOLIC PANEL WITH GFR
ALT: 22 U/L (ref 0–44)
AST: 54 U/L — ABNORMAL HIGH (ref 15–41)
Albumin: 4.4 g/dL (ref 3.5–5.0)
Alkaline Phosphatase: 159 U/L — ABNORMAL HIGH (ref 38–126)
Anion gap: 12 (ref 5–15)
BUN: 9 mg/dL (ref 6–20)
CO2: 25 mmol/L (ref 22–32)
Calcium: 9.6 mg/dL (ref 8.9–10.3)
Chloride: 100 mmol/L (ref 98–111)
Creatinine, Ser: 0.58 mg/dL — ABNORMAL LOW (ref 0.61–1.24)
GFR, Estimated: >60 mL/min (ref >60)
Glucose, Bld: 108 mg/dL — ABNORMAL HIGH (ref 70–99)
Potassium: 4 mmol/L (ref 3.5–5.1)
Sodium: 137 mmol/L (ref 135–145)
Total Bilirubin: 7.8 mg/dL — ABNORMAL HIGH (ref 0.0–1.2)
Total Protein: 7.6 g/dL (ref 6.5–8.1)

## 2024-01-11 LAB — RESPIRATORY PANEL BY PCR

## 2024-01-11 LAB — CBC
HCT: 25.5 % — ABNORMAL LOW (ref 39.0–52.0)
Hemoglobin: 9.1 g/dL — ABNORMAL LOW (ref 13.0–17.0)
MCH: 31.3 pg (ref 26.0–34.0)
MCHC: 35.7 g/dL (ref 30.0–36.0)
MCV: 87.6 fL (ref 80.0–100.0)
Platelets: 438 K/uL — ABNORMAL HIGH (ref 150–400)
RBC: 2.91 MIL/uL — ABNORMAL LOW (ref 4.22–5.81)
RDW: 19.9 % — ABNORMAL HIGH (ref 11.5–15.5)
WBC: 16 K/uL — ABNORMAL HIGH (ref 4.0–10.5)
nRBC: 1 % — ABNORMAL HIGH (ref 0.0–0.2)

## 2024-01-11 NOTE — Progress Notes (Addendum)
 Patient ID: Rick Jefferson, male   DOB: [birth year], 18 y.o.   MRN: [id number] Subjective: Rick Jefferson is a 18 y.o. male with medical history significant of asthma and sickle cell disease who presented to ED with complaints of bilateral leg pain.  He was subsequently admitted for sickle cell pain crisis.   Patient is reporting pain of 6/10 a significant improvement from admission at 10/10. He has no new concerns. Denies cough, N/V/D. No urinary symptoms, no recent sick contacts.   Objective:  Vital signs in last 24 hours:  Vitals:   01/11/24 0134 01/11/24 0351 01/11/24 0602 01/11/24 0837  BP: 134/81  98/63   Pulse: 98     Resp: 17 18 17 20   Temp: 99.1 F (37.3 C)  (!) 100.7 F (38.2 C)   TempSrc: Oral  Oral   SpO2: 96% 99% 98%   Weight:      Height:        Intake/Output from previous day:   Intake/Output Summary (Last 24 hours) at 01/11/2024 0855 Last data filed at 01/11/2024 0600 Gross per 24 hour  Intake 1080 ml  Output --  Net 1080 ml    Physical Exam: General: Alert, awake, oriented x3, in no acute distress.  HEENT: [place]/AT PEERL, EOMI Neck: Trachea midline,  no masses, no thyromegal,y no JVD, no carotid bruit OROPHARYNX:  Moist, No exudate/ erythema/lesions.  Heart: Regular rate and rhythm, without murmurs, rubs, gallops, PMI non-displaced, no heaves or thrills on palpation.  Lungs: Clear to auscultation, no wheezing or rhonchi noted. No increased vocal fremitus resonant to percussion  Abdomen: Soft, nontender, nondistended, positive bowel sounds, no masses no hepatosplenomegaly noted..  Neuro: No focal neurological deficits noted cranial nerves II through XII grossly intact. DTRs 2+ bilaterally upper and lower extremities. Strength 5 out of 5 in bilateral upper and lower extremities. Musculoskeletal: Generalize body tenderness Psychiatric: Patient alert and oriented x3, good insight and cognition, good recent to remote recall. Lymph node survey: No cervical  axillary or inguinal lymphadenopathy noted.  Lab Results:  Basic Metabolic Panel:    Component Value Date/Time   NA 137 01/11/2024 0338   K 4.0 01/11/2024 0338   CL 100 01/11/2024 0338   CO2 25 01/11/2024 0338   BUN 9 01/11/2024 0338   CREATININE 0.58 (L) 01/11/2024 0338   GLUCOSE 108 (H) 01/11/2024 0338   CALCIUM  9.6 01/11/2024 0338   CBC:    Component Value Date/Time   WBC 16.0 (H) 01/11/2024 0338   HGB 9.1 (L) 01/11/2024 0338   HCT 25.5 (L) 01/11/2024 0338   PLT 438 (H) 01/11/2024 0338   MCV 87.6 01/11/2024 0338   NEUTROABS 8.7 (H) 01/09/2024 0419   LYMPHSABS 3.9 01/09/2024 0419   MONOABS 1.7 (H) 01/09/2024 0419   EOSABS 0.0 01/09/2024 0419   BASOSABS 0.1 01/09/2024 0419    Recent Results (from the past 240 hours)  Culture, blood (Routine X 2) w Reflex to ID Panel     Status: None (Preliminary result)   Collection Time: 01/10/24  1:11 PM   Specimen: BLOOD RIGHT ARM  Result Value Ref Range Status   Specimen Description BLOOD RIGHT ARM  Final   Special Requests   Final    BOTTLES DRAWN AEROBIC AND ANAEROBIC Blood Culture results may not be optimal due to an inadequate volume of blood received in culture bottles   Culture   Final    NO GROWTH < 12 HOURS Performed at [facility], [address] [place],  [place] [address]    Report Status PENDING  Incomplete  Culture, blood (Routine X 2) w Reflex to ID Panel     Status: None (Preliminary result)   Collection Time: 01/10/24  1:11 PM   Specimen: BLOOD RIGHT HAND  Result Value Ref Range Status   Specimen Description BLOOD RIGHT HAND  Final   Special Requests   Final    BOTTLES DRAWN AEROBIC AND ANAEROBIC Blood Culture adequate volume   Culture   Final    NO GROWTH < 12 HOURS Performed at [facility], [address] [place], [place] [address]    Report Status PENDING  Incomplete    Studies/Results: CT Angio Chest Pulmonary Embolism (PE) W or WO Contrast Result Date: 01/10/2024 EXAM: CTA of the  Chest with contrast for PE 01/10/2024 02:10:29 PM TECHNIQUE: CTA of the chest was performed after the administration of 75 mL of intravenous contrast (iohexol  (OMNIPAQUE ) 350 MG/ML). Multiplanar reformatted images are provided for review. MIP images are provided for review. Automated exposure control, iterative reconstruction, and/or weight based adjustment of the mA/kV was utilized to reduce the radiation dose to as low as reasonably achievable. COMPARISON: Chest radiograph 01/09/2024. Motion artifact limits examination. CLINICAL HISTORY: Pulmonary embolism (PE) suspected, high prob. FINDINGS: PULMONARY ARTERIES: Pulmonary arteries are adequately opacified for evaluation. No pulmonary embolism. Main pulmonary artery is normal in caliber. MEDIASTINUM: Cardiac enlargement. No pericardial effusions. There is no acute abnormality of the thoracic aorta. LYMPH NODES: No mediastinal, hilar or axillary lymphadenopathy. LUNGS AND PLEURA: Focal areas of bandlike consolidation in both lung bases may represent or scarring. No pleural effusion or pneumothorax. UPPER ABDOMEN: Limited images of the upper abdomen are unremarkable. SOFT TISSUES AND BONES: No acute bone or soft tissue abnormality. IMPRESSION: 1. No pulmonary embolism. 2. Bibasilar bandlike consolidation, likely atelectasis or scarring. Electronically signed by: Elsie Gravely MD 01/10/2024 02:21 PM EST RP Workstation: [id number]    Medications: Scheduled Meds:  folic acid   1 mg Oral Daily   HYDROmorphone    Intravenous Q4H   hydroxyurea   1,500 mg Oral Daily   ketorolac   15 mg Intravenous Q6H   lidocaine   1 patch Transdermal Daily   morphine   30 mg Oral Q12H   senna-docusate  1 tablet Oral BID   Continuous Infusions: PRN Meds:.acetaminophen , diphenhydrAMINE  **OR** diphenhydrAMINE , naloxone  **AND** sodium chloride  flush, ondansetron  (ZOFRAN ) IV, oxyCODONE   Consultants: None  Procedures: None  Antibiotics: None  Assessment/Plan: Principal  Problem:   Sickle cell pain crisis Active Problems:   Tobacco abuse   Elevated blood pressure reading   Hb Sickle Cell Disease with Pain crisis: Pain is gradually improving, Continue IVF 0.45% Saline @ KVO, continue weight based Dilaudid  PCA, IV Toradol  15 mg Q 6 H for a total of 5 days, continue oral home pain medications as ordered. Monitor vitals very closely, Re-evaluate pain scale regularly, 2 L of Oxygen by [place]. Patient encouraged to ambulate on the hallway today.  Leukocytosis: Reactive to Vaso-occlusive crisis. Low grade fever in the last 24 hours will continue to monitor without antibiotics. And complete a respiratory panel.  Anemia of Chronic Disease:  Hemoglobin remains stable at baseline.  There is no clinical indication for blood transfusion at this time. Will continue to monitor closely and transfuse as appropriate.  Chronic pain Syndrome:  Continue oral home pain medications as ordered.  Elevated blood pressure: stable  Tobacco use disorder: Kameron was counseled on the dangers of tobacco use, and was advised to quit. Reviewed strategies to maximize  success, including removing cigarettes and smoking materials from environment, stress management and support of family/friends.   Code Status: Full Code Family Communication: N/A Disposition Plan: Not yet ready for discharge  Homer CHRISTELLA Cover NP   If 7PM-7AM, please contact night-coverage.  01/11/2024, 8:55 AM  LOS: 2 days

## 2024-01-11 NOTE — Progress Notes (Signed)
   01/10/24 2003  Assess: MEWS Score  Temp (!) 101 F (38.3 C)  BP 136/83  MAP (mmHg) 98  Pulse Rate (!) 104  Resp 19  SpO2 98 %  O2 Device Nasal Cannula  O2 Flow Rate (L/min) 2 L/min  Assess: MEWS Score  MEWS Temp 1  MEWS Systolic 0  MEWS Pulse 1  MEWS RR 0  MEWS LOC 0  MEWS Score 2  MEWS Score Color Yellow  Assess: if the MEWS score is Yellow or Red  Were vital signs accurate and taken at a resting state? Yes  Does the patient meet 2 or more of the SIRS criteria? Yes  Does the patient have a confirmed or suspected source of infection? No  MEWS guidelines implemented  Yes, yellow  Treat  MEWS Interventions Considered administering scheduled or prn medications/treatments as ordered  Take Vital Signs  Increase Vital Sign Frequency  Yellow: Q2hr x1, continue Q4hrs until patient remains green for 12hrs  Escalate  MEWS: Escalate Yellow: Discuss with charge nurse and consider notifying provider and/or RRT  Notify: Charge Nurse/RN  Name of Charge Nurse/RN Notified CN Lauren  Assess: SIRS CRITERIA  SIRS Temperature  1  SIRS Respirations  0  SIRS Pulse 1  SIRS WBC 0  SIRS Score Sum  2

## 2024-01-11 NOTE — Progress Notes (Signed)
   01/11/24 0924  TOC Brief Assessment  Insurance and Status Reviewed  Patient has primary care physician No  Home environment has been reviewed Resides in single family home with family  Prior level of function: Independent with ADLs at baseline  Prior/Current Home Services No current home services  Social Drivers of Health Review SDOH reviewed no interventions necessary  Readmission risk has been reviewed Yes  Transition of care needs no transition of care needs at this time

## 2024-01-12 DIAGNOSIS — H6690 Otitis media, unspecified, unspecified ear: Secondary | ICD-10-CM | POA: Insufficient documentation

## 2024-01-12 MED ORDER — AMOXICILLIN 500 MG PO CAPS
500.0000 mg | ORAL_CAPSULE | Freq: Two times a day (BID) | ORAL | Status: DC
  Administered 2024-01-12 – 2024-01-16 (×9): 500 mg via ORAL
  Filled 2024-01-12 (×9): qty 1

## 2024-01-12 MED ORDER — LACTULOSE 10 GM/15ML PO SOLN
30.0000 g | Freq: Once | ORAL | Status: AC
  Administered 2024-01-12: 30 g via ORAL
  Filled 2024-01-12: qty 60

## 2024-01-12 NOTE — Progress Notes (Addendum)
 Patient ID: Rick Jefferson, male   DOB: [birth year], 18 y.o.   MRN: [id number] Subjective: Rick Jefferson is a 18 y.o. male with medical history significant of asthma and sickle cell disease who presented to ED with complaints of bilateral leg pain.  He was subsequently admitted for sickle cell pain crisis.   Patient is reporting pain of 6/10 a significant improvement from admission at 10/10. And slight pain /discomfort in his right ear.low grade fever yesterday but resolved today. Denies cough, N/V/D. No urinary symptoms, no recent sick contacts.   Objective:  Vital signs in last 24 hours:  Vitals:   01/12/24 0118 01/12/24 0320 01/12/24 0526 01/12/24 0741  BP: 110/67  121/68   Pulse: 91  92   Resp: 18 18 16 18   Temp: 100 F (37.8 C)  98 F (36.7 C)   TempSrc: Oral  Oral   SpO2: 100% 100% 100% 92%  Weight:      Height:        Intake/Output from previous day:   Intake/Output Summary (Last 24 hours) at 01/12/2024 1147 Last data filed at 01/12/2024 0018 Gross per 24 hour  Intake 740 ml  Output --  Net 740 ml    Physical Exam: General: Alert, awake, oriented x3, in no acute distress.  HEENT: Otitis Media Neck: Trachea midline,  no masses, no thyromegal,y no JVD, no carotid bruit OROPHARYNX:  Moist, No exudate/ erythema/lesions.  Heart: Regular rate and rhythm, without murmurs, rubs, gallops, PMI non-displaced, no heaves or thrills on palpation.  Lungs: Clear to auscultation, no wheezing or rhonchi noted. No increased vocal fremitus resonant to percussion  Abdomen: Soft, nontender, nondistended, positive bowel sounds, no masses no hepatosplenomegaly noted..  Neuro: No focal neurological deficits noted cranial nerves II through XII grossly intact. DTRs 2+ bilaterally upper and lower extremities. Strength 5 out of 5 in bilateral upper and lower extremities. Musculoskeletal: Generalize body tenderness Psychiatric: Patient alert and oriented x3, good insight and cognition, good  recent to remote recall. Lymph node survey: No cervical axillary or inguinal lymphadenopathy noted.  Lab Results:  Basic Metabolic Panel:    Component Value Date/Time   NA 137 01/11/2024 0338   K 4.0 01/11/2024 0338   CL 100 01/11/2024 0338   CO2 25 01/11/2024 0338   BUN 9 01/11/2024 0338   CREATININE 0.58 (L) 01/11/2024 0338   GLUCOSE 108 (H) 01/11/2024 0338   CALCIUM  9.6 01/11/2024 0338   CBC:    Component Value Date/Time   WBC 16.0 (H) 01/11/2024 0338   HGB 9.1 (L) 01/11/2024 0338   HCT 25.5 (L) 01/11/2024 0338   PLT 438 (H) 01/11/2024 0338   MCV 87.6 01/11/2024 0338   NEUTROABS 8.7 (H) 01/09/2024 0419   LYMPHSABS 3.9 01/09/2024 0419   MONOABS 1.7 (H) 01/09/2024 0419   EOSABS 0.0 01/09/2024 0419   BASOSABS 0.1 01/09/2024 0419    Recent Results (from the past 240 hours)  Culture, blood (Routine X 2) w Reflex to ID Panel     Status: None (Preliminary result)   Collection Time: 01/10/24  1:11 PM   Specimen: BLOOD RIGHT ARM  Result Value Ref Range Status   Specimen Description BLOOD RIGHT ARM  Final   Special Requests   Final    BOTTLES DRAWN AEROBIC AND ANAEROBIC Blood Culture results may not be optimal due to an inadequate volume of blood received in culture bottles   Culture   Final    NO GROWTH 2 DAYS Performed at [facility], [address], [place], [place] [address]    Report Status PENDING  Incomplete  Culture, blood (Routine X 2) w Reflex to ID Panel     Status: None (Preliminary result)   Collection Time: 01/10/24  1:11 PM   Specimen: BLOOD RIGHT HAND  Result Value Ref Range Status   Specimen Description BLOOD RIGHT HAND  Final   Special Requests   Final    BOTTLES DRAWN AEROBIC AND ANAEROBIC Blood Culture adequate volume   Culture   Final    NO GROWTH 2 DAYS Performed at [facility], [address], [place], [place] [address]    Report Status PENDING  Incomplete  Respiratory (~20 pathogens) panel by PCR     Status: None   Collection  Time: 01/11/24 11:47 AM   Specimen: Nasopharyngeal Swab; Respiratory  Result Value Ref Range Status   Adenovirus NOT DETECTED NOT DETECTED Final   Coronavirus 229E NOT DETECTED NOT DETECTED Final    Comment: (NOTE) The Coronavirus on the Respiratory Panel, DOES NOT test for the novel  Coronavirus (2019 nCoV)    Coronavirus HKU1 NOT DETECTED NOT DETECTED Final   Coronavirus NL63 NOT DETECTED NOT DETECTED Final   Coronavirus OC43 NOT DETECTED NOT DETECTED Final   Metapneumovirus NOT DETECTED NOT DETECTED Final   Rhinovirus / Enterovirus NOT DETECTED NOT DETECTED Final   Influenza A NOT DETECTED NOT DETECTED Final   Influenza B NOT DETECTED NOT DETECTED Final   Parainfluenza Virus 1 NOT DETECTED NOT DETECTED Final   Parainfluenza Virus 2 NOT DETECTED NOT DETECTED Final   Parainfluenza Virus 3 NOT DETECTED NOT DETECTED Final   Parainfluenza Virus 4 NOT DETECTED NOT DETECTED Final   Respiratory Syncytial Virus NOT DETECTED NOT DETECTED Final   Bordetella pertussis NOT DETECTED NOT DETECTED Final   Bordetella Parapertussis NOT DETECTED NOT DETECTED Final   Chlamydophila pneumoniae NOT DETECTED NOT DETECTED Final   Mycoplasma pneumoniae NOT DETECTED NOT DETECTED Final    Comment: Performed at [facility], [address], [place], [place] [address]    Studies/Results: CT Angio Chest Pulmonary Embolism (PE) W or WO Contrast Result Date: 01/10/2024 EXAM: CTA of the Chest with contrast for PE 01/10/2024 02:10:29 PM TECHNIQUE: CTA of the chest was performed after the administration of 75 mL of intravenous contrast (iohexol  (OMNIPAQUE ) 350 MG/ML). Multiplanar reformatted images are provided for review. MIP images are provided for review. Automated exposure control, iterative reconstruction, and/or weight based adjustment of the mA/kV was utilized to reduce the radiation dose to as low as reasonably achievable. COMPARISON: Chest radiograph 01/09/2024. Motion artifact limits examination.  CLINICAL HISTORY: Pulmonary embolism (PE) suspected, high prob. FINDINGS: PULMONARY ARTERIES: Pulmonary arteries are adequately opacified for evaluation. No pulmonary embolism. Main pulmonary artery is normal in caliber. MEDIASTINUM: Cardiac enlargement. No pericardial effusions. There is no acute abnormality of the thoracic aorta. LYMPH NODES: No mediastinal, hilar or axillary lymphadenopathy. LUNGS AND PLEURA: Focal areas of bandlike consolidation in both lung bases may represent or scarring. No pleural effusion or pneumothorax. UPPER ABDOMEN: Limited images of the upper abdomen are unremarkable. SOFT TISSUES AND BONES: No acute bone or soft tissue abnormality. IMPRESSION: 1. No pulmonary embolism. 2. Bibasilar bandlike consolidation, likely atelectasis or scarring. Electronically signed by: Elsie Gravely MD 01/10/2024 02:21 PM EST RP Workstation: [id number]    Medications: Scheduled Meds:  folic acid   1 mg Oral Daily   HYDROmorphone    Intravenous Q4H   hydroxyurea   1,500 mg Oral Daily  ketorolac   15 mg Intravenous Q6H   lidocaine   1 patch Transdermal Daily   morphine   30 mg Oral Q12H   senna-docusate  1 tablet Oral BID   Continuous Infusions: PRN Meds:.acetaminophen , diphenhydrAMINE  **OR** diphenhydrAMINE , naloxone  **AND** sodium chloride  flush, ondansetron  (ZOFRAN ) IV, oxyCODONE   Consultants: None  Procedures: None  Antibiotics: Amoxicillin   Assessment/Plan: Principal Problem:   Sickle cell pain crisis Active Problems:   Tobacco abuse   Elevated blood pressure reading   Otitis media   Hb Sickle Cell Disease with Pain crisis: Pain is gradually improving, Continue IVF 0.45% Saline @ KVO, continue weight based Dilaudid  PCA at current dose, IV Toradol  15 mg Q 6 H for a total of 5 days, continue oral home pain medications as ordered. Monitor vitals very closely, Re-evaluate pain scale regularly, 2 L of Oxygen by [place]. Patient encouraged to ambulate on the hallway today.  Otitis  media: complete Amoxicillin  as prescribed Leukocytosis: Reactive to Vaso-occlusive crisis. Low grade fever in the last 24 hours will continue to monitor without antibiotics. And complete a respiratory panel.  Anemia of Chronic Disease:  Hemoglobin remains stable at baseline.  There is no clinical indication for blood transfusion at this time. Will continue to monitor closely and transfuse as appropriate.  Chronic pain Syndrome:  Continue oral home pain medications as ordered.  Elevated blood pressure: stable  Tobacco use disorder: Jadriel was counseled on the dangers of tobacco use, and was advised to quit. Reviewed strategies to maximize success, including removing cigarettes and smoking materials from environment, stress management and support of family/friends.   Code Status: Full Code Family Communication: N/A Disposition Plan: Not yet ready for discharge  Homer CHRISTELLA Cover NP   If 7PM-7AM, please contact night-coverage.  01/12/2024, 11:47 AM  LOS: 3 days

## 2024-01-13 NOTE — Progress Notes (Signed)
 Patient ID: Rick Jefferson, male   DOB: 2005-09-13, 18 y.o.   MRN: [id number] Subjective: Rick Jefferson is a 18 y.o. male with medical history significant of asthma and sickle cell disease who presented to ED with complaints of bilateral leg pain.  He was subsequently admitted for sickle cell pain crisis.   Patient continues to report pain of 6/10 he continues to improve from admission. Ongoing discomfort in his right ear. Low grade fever resolved. Denies cough, N/V/D. No urinary symptoms, no recent sick contacts.   Objective:  Vital signs in last 24 hours:  Vitals:   01/13/24 0434 01/13/24 0508 01/13/24 0515 01/13/24 0908  BP: 109/73   (!) 103/57  Pulse: 94   75  Resp:  14 19 18   Temp: 98.7 F (37.1 C)   99.1 F (37.3 C)  TempSrc: Oral   Oral  SpO2: 100% 100% 100% 100%  Weight:      Height:        Intake/Output from previous day:   Intake/Output Summary (Last 24 hours) at 01/13/2024 1049 Last data filed at 01/12/2024 2200 Gross per 24 hour  Intake 50 ml  Output --  Net 50 ml    Physical Exam: General: Alert, awake, oriented x3, in no acute distress.  HEENT: Otitis Media Neck: Trachea midline,  no masses, no thyromegal,y no JVD, no carotid bruit OROPHARYNX:  Moist, No exudate/ erythema/lesions.  Heart: Regular rate and rhythm, without murmurs, rubs, gallops, PMI non-displaced, no heaves or thrills on palpation.  Lungs: Clear to auscultation, no wheezing or rhonchi noted. No increased vocal fremitus resonant to percussion  Abdomen: Soft, nontender, nondistended, positive bowel sounds, no masses no hepatosplenomegaly noted..  Neuro: No focal neurological deficits noted cranial nerves II through XII grossly intact. DTRs 2+ bilaterally upper and lower extremities. Strength 5 out of 5 in bilateral upper and lower extremities. Musculoskeletal: Generalize body tenderness Psychiatric: Patient alert and oriented x3, good insight and cognition, good recent to remote  recall. Lymph node survey: No cervical axillary or inguinal lymphadenopathy noted.  Lab Results:  Basic Metabolic Panel:    Component Value Date/Time   NA 137 01/11/2024 0338   K 4.0 01/11/2024 0338   CL 100 01/11/2024 0338   CO2 25 01/11/2024 0338   BUN 9 01/11/2024 0338   CREATININE 0.58 (L) 01/11/2024 0338   GLUCOSE 108 (H) 01/11/2024 0338   CALCIUM  9.6 01/11/2024 0338   CBC:    Component Value Date/Time   WBC 16.0 (H) 01/11/2024 0338   HGB 9.1 (L) 01/11/2024 0338   HCT 25.5 (L) 01/11/2024 0338   PLT 438 (H) 01/11/2024 0338   MCV 87.6 01/11/2024 0338   NEUTROABS 8.7 (H) 01/09/2024 0419   LYMPHSABS 3.9 01/09/2024 0419   MONOABS 1.7 (H) 01/09/2024 0419   EOSABS 0.0 01/09/2024 0419   BASOSABS 0.1 01/09/2024 0419    Recent Results (from the past 240 hours)  Culture, blood (Routine X 2) w Reflex to ID Panel     Status: None (Preliminary result)   Collection Time: 01/10/24  1:11 PM   Specimen: BLOOD RIGHT ARM  Result Value Ref Range Status   Specimen Description BLOOD RIGHT ARM  Final   Special Requests   Final    BOTTLES DRAWN AEROBIC AND ANAEROBIC Blood Culture results may not be optimal due to an inadequate volume of blood received in culture bottles   Culture   Final    NO GROWTH 3 DAYS Performed at [facility], [address], [place], [place] [address]    Report Status PENDING  Incomplete  Culture, blood (Routine X 2) w Reflex to ID Panel     Status: None (Preliminary result)   Collection Time: 01/10/24  1:11 PM   Specimen: BLOOD RIGHT HAND  Result Value Ref Range Status   Specimen Description BLOOD RIGHT HAND  Final   Special Requests   Final    BOTTLES DRAWN AEROBIC AND ANAEROBIC Blood Culture adequate volume   Culture   Final    NO GROWTH 3 DAYS Performed at [facility], [address] [place], [place] [address]    Report Status PENDING  Incomplete  Respiratory (~20 pathogens) panel by PCR     Status: None   Collection Time: 01/11/24  11:47 AM   Specimen: Nasopharyngeal Swab; Respiratory  Result Value Ref Range Status   Adenovirus NOT DETECTED NOT DETECTED Final   Coronavirus 229E NOT DETECTED NOT DETECTED Final    Comment: (NOTE) The Coronavirus on the Respiratory Panel, DOES NOT test for the novel  Coronavirus (2019 nCoV)    Coronavirus HKU1 NOT DETECTED NOT DETECTED Final   Coronavirus NL63 NOT DETECTED NOT DETECTED Final   Coronavirus OC43 NOT DETECTED NOT DETECTED Final   Metapneumovirus NOT DETECTED NOT DETECTED Final   Rhinovirus / Enterovirus NOT DETECTED NOT DETECTED Final   Influenza A NOT DETECTED NOT DETECTED Final   Influenza B NOT DETECTED NOT DETECTED Final   Parainfluenza Virus 1 NOT DETECTED NOT DETECTED Final   Parainfluenza Virus 2 NOT DETECTED NOT DETECTED Final   Parainfluenza Virus 3 NOT DETECTED NOT DETECTED Final   Parainfluenza Virus 4 NOT DETECTED NOT DETECTED Final   Respiratory Syncytial Virus NOT DETECTED NOT DETECTED Final   Bordetella pertussis NOT DETECTED NOT DETECTED Final   Bordetella Parapertussis NOT DETECTED NOT DETECTED Final   Chlamydophila pneumoniae NOT DETECTED NOT DETECTED Final   Mycoplasma pneumoniae NOT DETECTED NOT DETECTED Final    Comment: Performed at [facility], [address], [place], [place] [address]    Studies/Results: No results found.   Medications: Scheduled Meds:  amoxicillin   500 mg Oral Q12H   folic acid   1 mg Oral Daily   HYDROmorphone    Intravenous Q4H   hydroxyurea   1,500 mg Oral Daily   ketorolac   15 mg Intravenous Q6H   lidocaine   1 patch Transdermal Daily   morphine   30 mg Oral Q12H   senna-docusate  1 tablet Oral BID   Continuous Infusions: PRN Meds:.acetaminophen , diphenhydrAMINE  **OR** diphenhydrAMINE , naloxone  **AND** sodium chloride  flush, ondansetron  (ZOFRAN ) IV, oxyCODONE   Consultants: None  Procedures: None  Antibiotics: Amoxicillin   Assessment/Plan: Principal Problem:   Sickle cell pain crisis Active  Problems:   Tobacco abuse   Elevated blood pressure reading   Otitis media   Hb Sickle Cell Disease with Pain crisis: Pain is gradually improving, Continue IVF 0.45% Saline @ KVO, continue weight based Dilaudid  PCA at current dose, IV Toradol  15 mg Q 6 H for a total of 5 days, continue oral home pain medications as ordered. Monitor vitals very closely, Re-evaluate pain scale regularly, 2 L of Oxygen by [place]. Patient encouraged to ambulate on the hallway today.  Otitis media: complete Amoxicillin  as prescribed Leukocytosis: Reactive to Vaso-occlusive crisis. Low grade fever resolved.  respiratory panel negative for influenza/COVID-19 Anemia of Chronic Disease:  Hemoglobin remains stable at baseline. There is no clinical indication for blood transfusion at this time. Will continue to monitor closely and transfuse  as appropriate.  Chronic pain Syndrome:  Continue oral home pain medications as ordered.  Elevated blood pressure: stable  Tobacco use disorder: Jermine was counseled on the dangers of tobacco use, and was advised to quit. Reviewed strategies to maximize success, including removing cigarettes and smoking materials from environment, stress management and support of family/friends.   Code Status: Full Code Family Communication: N/A Disposition Plan: Not yet ready for discharge  Homer CHRISTELLA Cover NP   If 7PM-7AM, please contact night-coverage.  01/13/2024, 10:49 AM  LOS: 4 days

## 2024-01-14 DIAGNOSIS — D5700 Hb-SS disease with crisis, unspecified: Secondary | ICD-10-CM | POA: Diagnosis not present

## 2024-01-14 LAB — CBC
HCT: 20.5 % — ABNORMAL LOW (ref 39.0–52.0)
Hemoglobin: 7.3 g/dL — ABNORMAL LOW (ref 13.0–17.0)
MCH: 30.7 pg (ref 26.0–34.0)
MCHC: 35.6 g/dL (ref 30.0–36.0)
MCV: 86.1 fL (ref 80.0–100.0)
Platelets: 424 K/uL — ABNORMAL HIGH (ref 150–400)
RBC: 2.38 MIL/uL — ABNORMAL LOW (ref 4.22–5.81)
RDW: 18.4 % — ABNORMAL HIGH (ref 11.5–15.5)
WBC: 8.3 K/uL (ref 4.0–10.5)
nRBC: 0.2 % (ref 0.0–0.2)

## 2024-01-14 NOTE — Progress Notes (Signed)
 SICKLE CELL SERVICE PROGRESS NOTE  Rick Jefferson FMW:[id number] DOB: July 31, 2005 DOA: 01/08/2024 PCP: Pcp, No  Assessment/Plan: Principal Problem:   Sickle cell pain crisis Active Problems:   Elevated blood pressure reading   Tobacco abuse   Otitis media  Sickle cell pain crisis: Patient currently on Dilaudid  PCA, Toradol , IV fluids, also on oral home medications.  Will continue to titrate as the pain gets better. Otitis media: Patient is on amoxicillin .  Continue with antibiotics. Anemia of chronic disease: Continue to monitor H&H Leukocytosis: Continue to monitor white count Chronic pain syndrome: Continue chronic home regimen Essential hypertension: Continue to monitor blood pressure Tobacco abuse: Continue to work cessation counseling.  Code Status: Full code Family Communication: No family at bedside Disposition Plan: Home when ready  [facility]  Pager [phone]. If 7PM-7AM, please contact night-coverage.  01/14/2024, 8:46 AM  LOS: 5 days   Brief narrative: Rick Jefferson is a 18 y.o. male with medical history significant of asthma and sickle cell disease who presented to ED with complaints of bilateral leg pain.  He has been out of his hydroxyurea  x 1 week. He states today he started to get pain in his left lower leg which is typical, but also had pain in his right arm and neck. He had a cold last week, but no other illness and no fevers. He has no shortness of breath or chest pain. Denies any cough. He is not on chronic pain meds, only as needed for pain crisis.      He has been feeling good. Denies any fever/chills, vision changes/headaches, chest pain or palpitations, shortness of breath or cough, abdominal pain, N/V/D, dysuria or leg swelling.    He vapes, no alcohol.   Consultants: None  Procedures: Chest x-ray, CT angiogram of the chest  Antibiotics: Amoxicillin   HPI/Subjective: Patient is having pain still in the ER no fever no chills no nausea  vomiting or diarrhea.  Objective: Vitals:   01/14/24 0444 01/14/24 0514 01/14/24 0700 01/14/24 0750  BP: (!) 117/57   121/71  Pulse: 92   85  Resp: 18 20  18   Temp: (!) 100.4 F (38 C)  99.8 F (37.7 C) 99.9 F (37.7 C)  TempSrc: Oral  Oral Oral  SpO2: 100% 100%  100%  Weight:      Height:       Weight change:   Intake/Output Summary (Last 24 hours) at 01/14/2024 0846 Last data filed at 01/13/2024 2324 Gross per 24 hour  Intake 270 ml  Output --  Net 270 ml    General: Alert, awake, oriented x3, in no acute distress.  HEENT: [place]/AT PEERL, EOMI Neck: Trachea midline,  no masses, no thyromegal,y no JVD, no carotid bruit OROPHARYNX:  Moist, No exudate/ erythema/lesions.  Heart: Regular rate and rhythm, without murmurs, rubs, gallops, PMI non-displaced, no heaves or thrills on palpation.  Lungs: Clear to auscultation, no wheezing or rhonchi noted. No increased vocal fremitus resonant to percussion  Abdomen: Soft, nontender, nondistended, positive bowel sounds, no masses no hepatosplenomegaly noted..  Neuro: No focal neurological deficits noted cranial nerves II through XII grossly intact. DTRs 2+ bilaterally upper and lower extremities. Strength 5 out of 5 in bilateral upper and lower extremities. Musculoskeletal: No warm swelling or erythema around joints, no spinal tenderness noted. Psychiatric: Patient alert and oriented x3, good insight and cognition, good recent to remote recall. Lymph node survey: No cervical axillary or inguinal lymphadenopathy noted.   Data Reviewed: Basic Metabolic Panel: Recent Labs  Lab 01/08/24 1958 01/09/24 0419 01/11/24 0338  NA 137 140 137  K 4.3 4.3 4.0  CL 104 105 100  CO2 19* 22 25  GLUCOSE 112* 96 108*  BUN <5* <5* 9  CREATININE 0.60* 0.53* 0.58*  CALCIUM  9.9 9.2 9.6   Liver Function Tests: Recent Labs  Lab 01/08/24 1958 01/09/24 0419 01/11/24 0338  AST 54* 48* 54*  ALT 19 17 22   ALKPHOS 96 90 159*  BILITOT 3.5* 3.9* 7.8*   PROT 7.8 7.3 7.6  ALBUMIN 4.7 4.5 4.4   No results for input(s): LIPASE, AMYLASE in the last 168 hours. No results for input(s): AMMONIA in the last 168 hours. CBC: Recent Labs  Lab 01/08/24 1958 01/09/24 0419 01/10/24 1441 01/11/24 0338 01/14/24 0408  WBC 12.8* 14.6* 15.4* 16.0* 8.3  NEUTROABS 9.1* 8.7*  --   --   --   HGB 10.5* 9.2* 9.5* 9.1* 7.3*  HCT 29.1* 25.8* 26.4* 25.5* 20.5*  MCV 86.9 88.7 87.1 87.6 86.1  PLT 597* 501* 477* 438* 424*   Cardiac Enzymes: No results for input(s): CKTOTAL, CKMB, CKMBINDEX, TROPONINI in the last 168 hours. BNP (last 3 results) No results for input(s): BNP in the last 8760 hours.  ProBNP (last 3 results) No results for input(s): PROBNP in the last 8760 hours.  CBG: No results for input(s): GLUCAP in the last 168 hours.  Recent Results (from the past 240 hours)  Culture, blood (Routine X 2) w Reflex to ID Panel     Status: None (Preliminary result)   Collection Time: 01/10/24  1:11 PM   Specimen: BLOOD RIGHT ARM  Result Value Ref Range Status   Specimen Description BLOOD RIGHT ARM  Final   Special Requests   Final    BOTTLES DRAWN AEROBIC AND ANAEROBIC Blood Culture results may not be optimal due to an inadequate volume of blood received in culture bottles   Culture   Final    NO GROWTH 3 DAYS Performed at [facility], [address] [place], [place] [address]    Report Status PENDING  Incomplete  Culture, blood (Routine X 2) w Reflex to ID Panel     Status: None (Preliminary result)   Collection Time: 01/10/24  1:11 PM   Specimen: BLOOD RIGHT HAND  Result Value Ref Range Status   Specimen Description BLOOD RIGHT HAND  Final   Special Requests   Final    BOTTLES DRAWN AEROBIC AND ANAEROBIC Blood Culture adequate volume   Culture   Final    NO GROWTH 3 DAYS Performed at [facility], [address], [place], [place] [address]    Report Status PENDING  Incomplete  Respiratory (~20 pathogens)  panel by PCR     Status: None   Collection Time: 01/11/24 11:47 AM   Specimen: Nasopharyngeal Swab; Respiratory  Result Value Ref Range Status   Adenovirus NOT DETECTED NOT DETECTED Final   Coronavirus 229E NOT DETECTED NOT DETECTED Final    Comment: (NOTE) The Coronavirus on the Respiratory Panel, DOES NOT test for the novel  Coronavirus (2019 nCoV)    Coronavirus HKU1 NOT DETECTED NOT DETECTED Final   Coronavirus NL63 NOT DETECTED NOT DETECTED Final   Coronavirus OC43 NOT DETECTED NOT DETECTED Final   Metapneumovirus NOT DETECTED NOT DETECTED Final   Rhinovirus / Enterovirus NOT DETECTED NOT DETECTED Final   Influenza A NOT DETECTED NOT DETECTED Final   Influenza B NOT DETECTED NOT DETECTED Final   Parainfluenza Virus  1 NOT DETECTED NOT DETECTED Final   Parainfluenza Virus 2 NOT DETECTED NOT DETECTED Final   Parainfluenza Virus 3 NOT DETECTED NOT DETECTED Final   Parainfluenza Virus 4 NOT DETECTED NOT DETECTED Final   Respiratory Syncytial Virus NOT DETECTED NOT DETECTED Final   Bordetella pertussis NOT DETECTED NOT DETECTED Final   Bordetella Parapertussis NOT DETECTED NOT DETECTED Final   Chlamydophila pneumoniae NOT DETECTED NOT DETECTED Final   Mycoplasma pneumoniae NOT DETECTED NOT DETECTED Final    Comment: Performed at [facility], [address], [place], [place] [address]     Studies: CT Angio Chest Pulmonary Embolism (PE) W or WO Contrast Result Date: 01/10/2024 EXAM: CTA of the Chest with contrast for PE 01/10/2024 02:10:29 PM TECHNIQUE: CTA of the chest was performed after the administration of 75 mL of intravenous contrast (iohexol  (OMNIPAQUE ) 350 MG/ML). Multiplanar reformatted images are provided for review. MIP images are provided for review. Automated exposure control, iterative reconstruction, and/or weight based adjustment of the mA/kV was utilized to reduce the radiation dose to as low as reasonably achievable. COMPARISON: Chest radiograph 01/09/2024.  Motion artifact limits examination. CLINICAL HISTORY: Pulmonary embolism (PE) suspected, high prob. FINDINGS: PULMONARY ARTERIES: Pulmonary arteries are adequately opacified for evaluation. No pulmonary embolism. Main pulmonary artery is normal in caliber. MEDIASTINUM: Cardiac enlargement. No pericardial effusions. There is no acute abnormality of the thoracic aorta. LYMPH NODES: No mediastinal, hilar or axillary lymphadenopathy. LUNGS AND PLEURA: Focal areas of bandlike consolidation in both lung bases may represent or scarring. No pleural effusion or pneumothorax. UPPER ABDOMEN: Limited images of the upper abdomen are unremarkable. SOFT TISSUES AND BONES: No acute bone or soft tissue abnormality. IMPRESSION: 1. No pulmonary embolism. 2. Bibasilar bandlike consolidation, likely atelectasis or scarring. Electronically signed by: Elsie Gravely MD 01/10/2024 02:21 PM EST RP Workstation: [id number]   DG Chest 2 View Result Date: 01/09/2024 EXAM: 2 VIEW(S) XRAY OF THE CHEST 01/09/2024 12:39:06 AM COMPARISON: 09/06/2023 CLINICAL HISTORY: Sickle cell crisis (HCC) [phone] FINDINGS: LUNGS AND PLEURA: No focal pulmonary opacity. No pleural effusion. No pneumothorax. HEART AND MEDIASTINUM: No acute abnormality of the cardiac and mediastinal silhouettes. BONES AND SOFT TISSUES: No acute osseous abnormality. IMPRESSION: 1. No acute cardiopulmonary process. Electronically signed by: Oneil Devonshire MD 01/09/2024 12:41 AM EST RP Workstation: [id number]    Scheduled Meds:  amoxicillin   500 mg Oral Q12H   folic acid   1 mg Oral Daily   HYDROmorphone    Intravenous Q4H   hydroxyurea   1,500 mg Oral Daily   lidocaine   1 patch Transdermal Daily   morphine   30 mg Oral Q12H   senna-docusate  1 tablet Oral BID   Continuous Infusions:  Principal Problem:   Sickle cell pain crisis Active Problems:   Elevated blood pressure reading   Tobacco abuse   Otitis media

## 2024-01-15 DIAGNOSIS — D5700 Hb-SS disease with crisis, unspecified: Secondary | ICD-10-CM | POA: Diagnosis not present

## 2024-01-15 LAB — CULTURE, BLOOD (ROUTINE X 2)
Culture: NO GROWTH
Culture: NO GROWTH
Special Requests: ADEQUATE

## 2024-01-15 LAB — CBC
HCT: 20.4 % — ABNORMAL LOW (ref 39.0–52.0)
Hemoglobin: 7.4 g/dL — ABNORMAL LOW (ref 13.0–17.0)
MCH: 30.7 pg (ref 26.0–34.0)
MCHC: 36.3 g/dL — ABNORMAL HIGH (ref 30.0–36.0)
MCV: 84.6 fL (ref 80.0–100.0)
Platelets: 473 K/uL — ABNORMAL HIGH (ref 150–400)
RBC: 2.41 MIL/uL — ABNORMAL LOW (ref 4.22–5.81)
RDW: 18.4 % — ABNORMAL HIGH (ref 11.5–15.5)
WBC: 8.5 K/uL (ref 4.0–10.5)
nRBC: 0.4 % — ABNORMAL HIGH (ref 0.0–0.2)

## 2024-01-15 NOTE — Progress Notes (Signed)
 SICKLE CELL SERVICE PROGRESS NOTE  Rick Jefferson FMW:[id number] DOB: Sep 15, 2005 DOA: 01/08/2024 PCP: Pcp, No  Assessment/Plan: Principal Problem:   Sickle cell pain crisis Active Problems:   Elevated blood pressure reading   Tobacco abuse   Otitis media  Sickle cell pain crisis: Pain is improved.  Down to 5 out of 10.  Patient currently on Dilaudid  PCA, Toradol , IV fluids, also on oral home medications.  Will continue to titrate as the pain gets better. Otitis media: Patient is on amoxicillin .  Pain is much better.  No drainage.  Continue with antibiotics. Anemia of chronic disease: Continue to monitor H&H Leukocytosis: Continue to monitor white count Chronic pain syndrome: Continue chronic home regimen Essential hypertension: Continue to monitor blood pressure Tobacco abuse: Continue to work cessation counseling.  Code Status: Full code Family Communication: No family at bedside Disposition Plan: Home when ready  [facility]  Pager [phone]. If 7PM-7AM, please contact night-coverage.  01/15/2024, 1:34 PM  LOS: 6 days   Brief narrative: Rick Jefferson is a 18 y.o. male with medical history significant of asthma and sickle cell disease who presented to ED with complaints of bilateral leg pain.  He has been out of his hydroxyurea  x 1 week. He states today he started to get pain in his left lower leg which is typical, but also had pain in his right arm and neck. He had a cold last week, but no other illness and no fevers. He has no shortness of breath or chest pain. Denies any cough. He is not on chronic pain meds, only as needed for pain crisis.      He has been feeling good. Denies any fever/chills, vision changes/headaches, chest pain or palpitations, shortness of breath or cough, abdominal pain, N/V/D, dysuria or leg swelling.    He vapes, no alcohol.   Consultants: None  Procedures: Chest x-ray, CT angiogram of the  chest  Antibiotics: Amoxicillin   HPI/Subjective: Patient is doing better today.  Denied any new pain.  He is tolerating antibiotics well.  He is moving around.  Objective: Vitals:   01/15/24 0759 01/15/24 0923 01/15/24 1253 01/15/24 1315  BP:  114/65  105/64  Pulse:  91  81  Resp: 20 20 18 20   Temp:  100 F (37.8 C)  98.1 F (36.7 C)  TempSrc:      SpO2: 99% 100% 100% 100%  Weight:      Height:       Weight change:   Intake/Output Summary (Last 24 hours) at 01/15/2024 1334 Last data filed at 01/14/2024 1715 Gross per 24 hour  Intake 300 ml  Output --  Net 300 ml    General: Alert, awake, oriented x3, in no acute distress.  HEENT: [place]/AT PEERL, EOMI Neck: Trachea midline,  no masses, no thyromegal,y no JVD, no carotid bruit OROPHARYNX:  Moist, No exudate/ erythema/lesions.  Heart: Regular rate and rhythm, without murmurs, rubs, gallops, PMI non-displaced, no heaves or thrills on palpation.  Lungs: Clear to auscultation, no wheezing or rhonchi noted. No increased vocal fremitus resonant to percussion  Abdomen: Soft, nontender, nondistended, positive bowel sounds, no masses no hepatosplenomegaly noted..  Neuro: No focal neurological deficits noted cranial nerves II through XII grossly intact. DTRs 2+ bilaterally upper and lower extremities. Strength 5 out of 5 in bilateral upper and lower extremities. Musculoskeletal: No warm swelling or erythema around joints, no spinal tenderness noted. Psychiatric: Patient alert and oriented x3, good insight and cognition, good recent to remote recall. Lymph node  survey: No cervical axillary or inguinal lymphadenopathy noted.   Data Reviewed: Basic Metabolic Panel: Recent Labs  Lab 01/08/24 1958 01/09/24 0419 01/11/24 0338  NA 137 140 137  K 4.3 4.3 4.0  CL 104 105 100  CO2 19* 22 25  GLUCOSE 112* 96 108*  BUN <5* <5* 9  CREATININE 0.60* 0.53* 0.58*  CALCIUM  9.9 9.2 9.6   Liver Function Tests: Recent Labs  Lab  01/08/24 1958 01/09/24 0419 01/11/24 0338  AST 54* 48* 54*  ALT 19 17 22   ALKPHOS 96 90 159*  BILITOT 3.5* 3.9* 7.8*  PROT 7.8 7.3 7.6  ALBUMIN 4.7 4.5 4.4   No results for input(s): LIPASE, AMYLASE in the last 168 hours. No results for input(s): AMMONIA in the last 168 hours. CBC: Recent Labs  Lab 01/08/24 1958 01/09/24 0419 01/10/24 1441 01/11/24 0338 01/14/24 0408 01/15/24 0345  WBC 12.8* 14.6* 15.4* 16.0* 8.3 8.5  NEUTROABS 9.1* 8.7*  --   --   --   --   HGB 10.5* 9.2* 9.5* 9.1* 7.3* 7.4*  HCT 29.1* 25.8* 26.4* 25.5* 20.5* 20.4*  MCV 86.9 88.7 87.1 87.6 86.1 84.6  PLT 597* 501* 477* 438* 424* 473*   Cardiac Enzymes: No results for input(s): CKTOTAL, CKMB, CKMBINDEX, TROPONINI in the last 168 hours. BNP (last 3 results) No results for input(s): BNP in the last 8760 hours.  ProBNP (last 3 results) No results for input(s): PROBNP in the last 8760 hours.  CBG: No results for input(s): GLUCAP in the last 168 hours.  Recent Results (from the past 240 hours)  Culture, blood (Routine X 2) w Reflex to ID Panel     Status: None   Collection Time: 01/10/24  1:11 PM   Specimen: BLOOD RIGHT ARM  Result Value Ref Range Status   Specimen Description BLOOD RIGHT ARM  Final   Special Requests   Final    BOTTLES DRAWN AEROBIC AND ANAEROBIC Blood Culture results may not be optimal due to an inadequate volume of blood received in culture bottles   Culture   Final    NO GROWTH 5 DAYS Performed at [facility], [address] [place], [place] [address]    Report Status 01/15/2024 FINAL  Final  Culture, blood (Routine X 2) w Reflex to ID Panel     Status: None   Collection Time: 01/10/24  1:11 PM   Specimen: BLOOD RIGHT HAND  Result Value Ref Range Status   Specimen Description BLOOD RIGHT HAND  Final   Special Requests   Final    BOTTLES DRAWN AEROBIC AND ANAEROBIC Blood Culture adequate volume   Culture   Final    NO GROWTH 5 DAYS Performed at  [facility], [address] [place], [place] [address]    Report Status 01/15/2024 FINAL  Final  Respiratory (~20 pathogens) panel by PCR     Status: None   Collection Time: 01/11/24 11:47 AM   Specimen: Nasopharyngeal Swab; Respiratory  Result Value Ref Range Status   Adenovirus NOT DETECTED NOT DETECTED Final   Coronavirus 229E NOT DETECTED NOT DETECTED Final    Comment: (NOTE) The Coronavirus on the Respiratory Panel, DOES NOT test for the novel  Coronavirus (2019 nCoV)    Coronavirus HKU1 NOT DETECTED NOT DETECTED Final   Coronavirus NL63 NOT DETECTED NOT DETECTED Final   Coronavirus OC43 NOT DETECTED NOT DETECTED Final   Metapneumovirus NOT DETECTED NOT DETECTED Final   Rhinovirus / Enterovirus NOT  DETECTED NOT DETECTED Final   Influenza A NOT DETECTED NOT DETECTED Final   Influenza B NOT DETECTED NOT DETECTED Final   Parainfluenza Virus 1 NOT DETECTED NOT DETECTED Final   Parainfluenza Virus 2 NOT DETECTED NOT DETECTED Final   Parainfluenza Virus 3 NOT DETECTED NOT DETECTED Final   Parainfluenza Virus 4 NOT DETECTED NOT DETECTED Final   Respiratory Syncytial Virus NOT DETECTED NOT DETECTED Final   Bordetella pertussis NOT DETECTED NOT DETECTED Final   Bordetella Parapertussis NOT DETECTED NOT DETECTED Final   Chlamydophila pneumoniae NOT DETECTED NOT DETECTED Final   Mycoplasma pneumoniae NOT DETECTED NOT DETECTED Final    Comment: Performed at [facility], [address] [place], [place] [address]     Studies: CT Angio Chest Pulmonary Embolism (PE) W or WO Contrast Result Date: 01/10/2024 EXAM: CTA of the Chest with contrast for PE 01/10/2024 02:10:29 PM TECHNIQUE: CTA of the chest was performed after the administration of 75 mL of intravenous contrast (iohexol  (OMNIPAQUE ) 350 MG/ML). Multiplanar reformatted images are provided for review. MIP images are provided for review. Automated exposure control, iterative reconstruction, and/or weight based adjustment  of the mA/kV was utilized to reduce the radiation dose to as low as reasonably achievable. COMPARISON: Chest radiograph 01/09/2024. Motion artifact limits examination. CLINICAL HISTORY: Pulmonary embolism (PE) suspected, high prob. FINDINGS: PULMONARY ARTERIES: Pulmonary arteries are adequately opacified for evaluation. No pulmonary embolism. Main pulmonary artery is normal in caliber. MEDIASTINUM: Cardiac enlargement. No pericardial effusions. There is no acute abnormality of the thoracic aorta. LYMPH NODES: No mediastinal, hilar or axillary lymphadenopathy. LUNGS AND PLEURA: Focal areas of bandlike consolidation in both lung bases may represent or scarring. No pleural effusion or pneumothorax. UPPER ABDOMEN: Limited images of the upper abdomen are unremarkable. SOFT TISSUES AND BONES: No acute bone or soft tissue abnormality. IMPRESSION: 1. No pulmonary embolism. 2. Bibasilar bandlike consolidation, likely atelectasis or scarring. Electronically signed by: Elsie Gravely MD 01/10/2024 02:21 PM EST RP Workstation: [id number]   DG Chest 2 View Result Date: 01/09/2024 EXAM: 2 VIEW(S) XRAY OF THE CHEST 01/09/2024 12:39:06 AM COMPARISON: 09/06/2023 CLINICAL HISTORY: Sickle cell crisis (HCC) [phone] FINDINGS: LUNGS AND PLEURA: No focal pulmonary opacity. No pleural effusion. No pneumothorax. HEART AND MEDIASTINUM: No acute abnormality of the cardiac and mediastinal silhouettes. BONES AND SOFT TISSUES: No acute osseous abnormality. IMPRESSION: 1. No acute cardiopulmonary process. Electronically signed by: Oneil Devonshire MD 01/09/2024 12:41 AM EST RP Workstation: [id number]    Scheduled Meds:  amoxicillin   500 mg Oral Q12H   folic acid   1 mg Oral Daily   HYDROmorphone    Intravenous Q4H   hydroxyurea   1,500 mg Oral Daily   lidocaine   1 patch Transdermal Daily   morphine   30 mg Oral Q12H   senna-docusate  1 tablet Oral BID   Continuous Infusions:  Principal Problem:   Sickle cell pain crisis Active  Problems:   Elevated blood pressure reading   Tobacco abuse   Otitis media

## 2024-01-16 DIAGNOSIS — D5700 Hb-SS disease with crisis, unspecified: Secondary | ICD-10-CM | POA: Diagnosis not present

## 2024-01-16 LAB — CBC
HCT: 21.9 % — ABNORMAL LOW (ref 39.0–52.0)
Hemoglobin: 7.8 g/dL — ABNORMAL LOW (ref 13.0–17.0)
MCH: 30.4 pg (ref 26.0–34.0)
MCHC: 35.6 g/dL (ref 30.0–36.0)
MCV: 85.2 fL (ref 80.0–100.0)
Platelets: 605 K/uL — ABNORMAL HIGH (ref 150–400)
RBC: 2.57 MIL/uL — ABNORMAL LOW (ref 4.22–5.81)
RDW: 18.5 % — ABNORMAL HIGH (ref 11.5–15.5)
WBC: 9.3 K/uL (ref 4.0–10.5)
nRBC: 0.2 % (ref 0.0–0.2)

## 2024-01-16 MED ORDER — OXYCODONE HCL 5 MG PO TABS: 5.0000 mg | ORAL_TABLET | Freq: Every day | ORAL | 0 refills | Status: DC | PRN

## 2024-01-16 MED ORDER — PANTOPRAZOLE SODIUM 40 MG PO TBEC
40.0000 mg | DELAYED_RELEASE_TABLET | Freq: Every day | ORAL | Status: DC
  Administered 2024-01-16: 40 mg via ORAL
  Filled 2024-01-16: qty 1

## 2024-01-16 NOTE — Discharge Summary (Signed)
 Physician Discharge Summary   Patient: Rick Jefferson MRN: [id number] DOB: 09-22-2005  Admit date:     01/08/2024  Discharge date: 01/16/2024  Discharge Physician: SIM KNOLL   PCP: Pcp, No   Recommendations at discharge:   Patient to complete antibiotics and follow-up with PCP  Discharge Diagnoses: Principal Problem:   Sickle cell pain crisis Active Problems:   Elevated blood pressure reading   Tobacco abuse   Otitis media  Resolved Problems:   * No resolved hospital problems. [facility] Course: Patient was admitted with sickle cell pain crisis, right eye pain later found to be otitis media.  Was started on Dilaudid  PCA, Toradol , IV fluids.  Patient also started on oral antibiotics for the otitis media.  He used ampicillin.  Pain gradually improved.  He was back to baseline.  At the end of the day patient discharged home on oral antibiotics and will follow-up with PCP.  Patient has been giving his short acting pain medications as well as oral antibiotics.  Work note was also given.  Assessment and Plan: * Sickle cell pain crisis 18 year old male with history of sickle cell anemia who presented to ED with complaints of leg pain, increased reticulocyte count concerning for sickle cell crisis  -obs to progressive for sickle cell crisis -sickle cell protocol ordered -D51/2 NS at 100cc/hour -toradol   -reduced dose PCA (not on chronic opioids) -CXR pending, denies any chest pain or shortness of breath  -reticulocytes elevated, hgb stable -transfuse if hgb <7  -start back hydroxyurea  and folic acid   -sickle cell team to take over in AM  Elevated blood pressure reading Mild elevated readings and in pain.  Monitor closely   Tobacco abuse Counseled on cessation          Consultants: None Procedures performed: Chest x-ray, Disposition: Home Diet recommendation:  Discharge Diet Orders (From admission, onward)     Start     Ordered   01/16/24 0000  Diet - low  sodium heart healthy        01/16/24 1438           Regular diet DISCHARGE MEDICATION: Allergies as of 01/16/2024   No Known Allergies      Medication List     STOP taking these medications    morphine  30 MG 12 hr tablet Commonly known as: MS CONTIN        TAKE these medications    folic acid  1 MG tablet Commonly known as: FOLVITE  Take 1 mg by mouth daily.   hydroxyurea  500 MG capsule Commonly known as: HYDREA  Take 1,500 mg by mouth daily.   ibuprofen  600 MG tablet Commonly known as: ADVIL  Take 1 tablet (600 mg total) by mouth every 6 (six) hours as needed. What changed:  when to take this reasons to take this   oxyCODONE  5 MG immediate release tablet Commonly known as: Oxy IR/ROXICODONE  Take 1 tablet (5 mg total) by mouth daily as needed for severe pain (pain score 7-10) or moderate pain (pain score 4-6).   polyethylene glycol 17 g packet Commonly known as: MIRALAX  / GLYCOLAX  Take 17 g by mouth 2 (two) times daily. What changed:  when to take this reasons to take this   Tylenol  325 MG Caps Generic drug: Acetaminophen  Take 650 mg by mouth every 6 (six) hours as needed (pain). What changed: when to take this        Discharge Exam: Filed Weights   01/09/24 0323  Weight: 69.9 kg  Constitutional: NAD, calm, comfortable Eyes: PERRL, lids and conjunctivae normal ENMT: Mucous membranes are moist. Posterior pharynx clear of any exudate or lesions.Normal dentition.  Neck: normal, supple, no masses, no thyromegaly Respiratory: clear to auscultation bilaterally, no wheezing, no crackles. Normal respiratory effort. No accessory muscle use.  Cardiovascular: Regular rate and rhythm, no murmurs / rubs / gallops. No extremity edema. 2+ pedal pulses. No carotid bruits.  Abdomen: no tenderness, no masses palpated. No hepatosplenomegaly. Bowel sounds positive.  Musculoskeletal: Good range of motion, no joint swelling or tenderness, Skin: no rashes, lesions,  ulcers. No induration Neurologic: CN 2-12 grossly intact. Sensation intact, DTR normal. Strength 5/5 in all 4.  Psychiatric: Normal judgment and insight. Alert and oriented x 3. Normal mood   Condition at discharge: good  The results of significant diagnostics from this hospitalization (including imaging, microbiology, ancillary and laboratory) are listed below for reference.   Imaging Studies: CT Angio Chest Pulmonary Embolism (PE) W or WO Contrast Result Date: 01/10/2024 EXAM: CTA of the Chest with contrast for PE 01/10/2024 02:10:29 PM TECHNIQUE: CTA of the chest was performed after the administration of 75 mL of intravenous contrast (iohexol  (OMNIPAQUE ) 350 MG/ML). Multiplanar reformatted images are provided for review. MIP images are provided for review. Automated exposure control, iterative reconstruction, and/or weight based adjustment of the mA/kV was utilized to reduce the radiation dose to as low as reasonably achievable. COMPARISON: Chest radiograph 01/09/2024. Motion artifact limits examination. CLINICAL HISTORY: Pulmonary embolism (PE) suspected, high prob. FINDINGS: PULMONARY ARTERIES: Pulmonary arteries are adequately opacified for evaluation. No pulmonary embolism. Main pulmonary artery is normal in caliber. MEDIASTINUM: Cardiac enlargement. No pericardial effusions. There is no acute abnormality of the thoracic aorta. LYMPH NODES: No mediastinal, hilar or axillary lymphadenopathy. LUNGS AND PLEURA: Focal areas of bandlike consolidation in both lung bases may represent or scarring. No pleural effusion or pneumothorax. UPPER ABDOMEN: Limited images of the upper abdomen are unremarkable. SOFT TISSUES AND BONES: No acute bone or soft tissue abnormality. IMPRESSION: 1. No pulmonary embolism. 2. Bibasilar bandlike consolidation, likely atelectasis or scarring. Electronically signed by: Elsie Gravely MD 01/10/2024 02:21 PM EST RP Workstation: [id number]   DG Chest 2 View Result Date:  01/09/2024 EXAM: 2 VIEW(S) XRAY OF THE CHEST 01/09/2024 12:39:06 AM COMPARISON: 09/06/2023 CLINICAL HISTORY: Sickle cell crisis (HCC) [phone] FINDINGS: LUNGS AND PLEURA: No focal pulmonary opacity. No pleural effusion. No pneumothorax. HEART AND MEDIASTINUM: No acute abnormality of the cardiac and mediastinal silhouettes. BONES AND SOFT TISSUES: No acute osseous abnormality. IMPRESSION: 1. No acute cardiopulmonary process. Electronically signed by: Oneil Devonshire MD 01/09/2024 12:41 AM EST RP Workstation: [id number]    Microbiology: Results for orders placed or performed during the hospital encounter of 01/08/24  Culture, blood (Routine X 2) w Reflex to ID Panel     Status: None   Collection Time: 01/10/24  1:11 PM   Specimen: BLOOD RIGHT ARM  Result Value Ref Range Status   Specimen Description BLOOD RIGHT ARM  Final   Special Requests   Final    BOTTLES DRAWN AEROBIC AND ANAEROBIC Blood Culture results may not be optimal due to an inadequate volume of blood received in culture bottles   Culture   Final    NO GROWTH 5 DAYS Performed at [facility], [address] [place], [place] [address]    Report Status 01/15/2024 FINAL  Final  Culture, blood (Routine X 2) w Reflex to ID Panel     Status: None   Collection Time: 01/10/24  1:11 PM   Specimen: BLOOD RIGHT HAND  Result Value Ref Range Status   Specimen Description BLOOD RIGHT HAND  Final   Special Requests   Final    BOTTLES DRAWN AEROBIC AND ANAEROBIC Blood Culture adequate volume   Culture   Final    NO GROWTH 5 DAYS Performed at [facility], [address] [place], [place] [address]    Report Status 01/15/2024 FINAL  Final  Respiratory (~20 pathogens) panel by PCR     Status: None   Collection Time: 01/11/24 11:47 AM   Specimen: Nasopharyngeal Swab; Respiratory  Result Value Ref Range Status   Adenovirus NOT DETECTED NOT DETECTED Final   Coronavirus 229E NOT DETECTED NOT DETECTED Final    Comment: (NOTE) The  Coronavirus on the Respiratory Panel, DOES NOT test for the novel  Coronavirus (2019 nCoV)    Coronavirus HKU1 NOT DETECTED NOT DETECTED Final   Coronavirus NL63 NOT DETECTED NOT DETECTED Final   Coronavirus OC43 NOT DETECTED NOT DETECTED Final   Metapneumovirus NOT DETECTED NOT DETECTED Final   Rhinovirus / Enterovirus NOT DETECTED NOT DETECTED Final   Influenza A NOT DETECTED NOT DETECTED Final   Influenza B NOT DETECTED NOT DETECTED Final   Parainfluenza Virus 1 NOT DETECTED NOT DETECTED Final   Parainfluenza Virus 2 NOT DETECTED NOT DETECTED Final   Parainfluenza Virus 3 NOT DETECTED NOT DETECTED Final   Parainfluenza Virus 4 NOT DETECTED NOT DETECTED Final   Respiratory Syncytial Virus NOT DETECTED NOT DETECTED Final   Bordetella pertussis NOT DETECTED NOT DETECTED Final   Bordetella Parapertussis NOT DETECTED NOT DETECTED Final   Chlamydophila pneumoniae NOT DETECTED NOT DETECTED Final   Mycoplasma pneumoniae NOT DETECTED NOT DETECTED Final    Comment: Performed at [facility], [address] [place], [place] [address]    Labs: CBC: Recent Labs  Lab 01/11/24 [phone] 01/14/24 0408 01/15/24 0345 01/16/24 0318  WBC 16.0* 8.3 8.5 9.3  HGB 9.1* 7.3* 7.4* 7.8*  HCT 25.5* 20.5* 20.4* 21.9*  MCV 87.6 86.1 84.6 85.2  PLT 438* 424* 473* 605*   Basic Metabolic Panel: Recent Labs  Lab 01/11/24 0338  NA 137  K 4.0  CL 100  CO2 25  GLUCOSE 108*  BUN 9  CREATININE 0.58*  CALCIUM  9.6   Liver Function Tests: Recent Labs  Lab 01/11/24 0338  AST 54*  ALT 22  ALKPHOS 159*  BILITOT 7.8*  PROT 7.6  ALBUMIN 4.4   CBG: No results for input(s): GLUCAP in the last 168 hours.  Discharge time spent: greater than 30 minutes.  SignedBETHA SIM KNOLL, MD [facility] 01/17/2024

## 2024-01-16 NOTE — Progress Notes (Signed)
 Call to grandmother, Kadien Lineman, patient's legal guardian - confirmed by patient and grandmother of discharge status, Patient's uncle will pick patient up in approximately 30 minutes

## 2024-01-16 NOTE — Plan of Care (Signed)
  Problem: Education: Goal: Knowledge of vaso-occlusive preventative measures will improve Outcome: Adequate for Discharge Goal: Awareness of infection prevention will improve Outcome: Adequate for Discharge Goal: Awareness of signs and symptoms of anemia will improve Outcome: Adequate for Discharge Goal: Long-term complications will improve Outcome: Adequate for Discharge   Problem: Self-Care: Goal: Ability to incorporate actions that prevent/reduce pain crisis will improve Outcome: Adequate for Discharge   Problem: Bowel/Gastric: Goal: Gut motility will be maintained Outcome: Adequate for Discharge   Problem: Tissue Perfusion: Goal: Complications related to inadequate tissue perfusion will be avoided or minimized Outcome: Adequate for Discharge   Problem: Respiratory: Goal: Pulmonary complications will be avoided or minimized Outcome: Adequate for Discharge Goal: Acute Chest Syndrome will be identified early to prevent complications Outcome: Adequate for Discharge   Problem: Health Behavior: Goal: Postive changes in compliance with treatment and prescription regimens will improve Outcome: Adequate for Discharge   Problem: Education: Goal: Knowledge of General Education information will improve Description: Including pain rating scale, medication(s)/side effects and non-pharmacologic comfort measures Outcome: Adequate for Discharge   Problem: Health Behavior/Discharge Planning: Goal: Ability to manage health-related needs will improve Outcome: Adequate for Discharge   Problem: Clinical Measurements: Goal: Ability to maintain clinical measurements within normal limits will improve Outcome: Adequate for Discharge Goal: Will remain free from infection Outcome: Adequate for Discharge Goal: Diagnostic test results will improve Outcome: Adequate for Discharge Goal: Respiratory complications will improve Outcome: Adequate for Discharge Goal: Cardiovascular complication  will be avoided Outcome: Adequate for Discharge

## 2024-01-17 NOTE — Hospital Course (Signed)
 Patient was admitted with sickle cell pain crisis, right eye pain later found to be otitis media.  Was started on Dilaudid  PCA, Toradol , IV fluids.  Patient also started on oral antibiotics for the otitis media.  He used ampicillin.  Pain gradually improved.  He was back to baseline.  At the end of the day patient discharged home on oral antibiotics and will follow-up with PCP.  Patient has been giving his short acting pain medications as well as oral antibiotics.  Work note was also given.

## 2024-01-18 NOTE — Progress Notes (Signed)
 Care Navigator Case Note:   S: Member not enrolled in Care Navigation services.   B: Member identified through Patient Ping Discharge report for IPA from 11/21-11/29/25 @ [facility] for Sickle Cell pain crisis.   A: ACN attempted 1st call to member to follow up on Hospitalization. No answer.   R: ACN will attempt 2nd call to member to follow up on Hospitalization on 01/19/24.   Tilton Myra Goltz, RN-BSN, [facility] [place], [place] [address] [phone]

## 2024-01-19 NOTE — Progress Notes (Signed)
 Care Navigator Case Note:   S: Member not enrolled in Care Navigation services.    B: Member identified through Patient Ping Discharge report for IPA from 11/21-11/29/25 @ [facility] for Sickle Cell pain crisis.   A: ACN attempted 2nd call to member to follow up on Hospitalization. No answer. ACN will proceed with 14-day Unable to Reach (UTR) process.   R: UTR letter sent via member's [facility] Patient Portal on 01/19/24 with ACN's contact information. If no response, will close on 02/02/24.   Tilton Myra Goltz, RN-BSN, [facility] [place], [place] [address] [phone]

## 2024-02-01 NOTE — Progress Notes (Signed)
 Care Navigator Case Closure Note:   S: Member not enrolled in Care Navigation services.    B: Member identified through Patient Ping Discharge report for IPA from 11/21-11/29/25 @ [facility] for Sickle Cell pain crisis.    A: Ambulatory Care Navigator (ACN) made multiple attempts to contact the member with no response or return call. ACN will proceed with case closure per 14-day Unable to Reach (UTR) process.    R: If further needs arise, a new referral can be sent for Care Management. Unable to Reach (UTR) letter sent via [facility] Patient Portal on 02/19/23 with Ambulatory Care Navigator contact information.   Tilton Myra Goltz, RN-BSN, [facility] [place], [place] [address] [phone]

## 2024-02-14 ENCOUNTER — Emergency Department (HOSPITAL_COMMUNITY)
Admission: EM | Admit: 2024-02-14 | Discharge: 2024-02-14 | Disposition: A | Discharge status: Home / Self Care | Attending: Emergency Medicine | Admitting: Emergency Medicine

## 2024-02-14 ENCOUNTER — Encounter (HOSPITAL_COMMUNITY): Payer: Self-pay

## 2024-02-14 ENCOUNTER — Other Ambulatory Visit: Payer: Self-pay

## 2024-02-14 DIAGNOSIS — D5700 Hb-SS disease with crisis, unspecified: Secondary | ICD-10-CM | POA: Diagnosis present

## 2024-02-14 DIAGNOSIS — M79605 Pain in left leg: Secondary | ICD-10-CM | POA: Diagnosis not present

## 2024-02-14 DIAGNOSIS — M545 Low back pain, unspecified: Secondary | ICD-10-CM | POA: Insufficient documentation

## 2024-02-14 DIAGNOSIS — M79604 Pain in right leg: Secondary | ICD-10-CM | POA: Insufficient documentation

## 2024-02-14 LAB — CBC WITH DIFFERENTIAL/PLATELET
Basophils Absolute: 0.4 K/uL — ABNORMAL HIGH (ref 0.0–0.1)
Basophils Relative: 3 %
Eosinophils Absolute: 0 K/uL (ref 0.0–0.5)
Eosinophils Relative: 0 %
HCT: 30.5 % — ABNORMAL LOW (ref 39.0–52.0)
Hemoglobin: 11 g/dL — ABNORMAL LOW (ref 13.0–17.0)
Lymphocytes Relative: 19 %
Lymphs Abs: 2.6 K/uL (ref 0.7–4.0)
MCH: 30.6 pg (ref 26.0–34.0)
MCHC: 36.1 g/dL — ABNORMAL HIGH (ref 30.0–36.0)
MCV: 85 fL (ref 80.0–100.0)
Monocytes Absolute: 1.2 K/uL — ABNORMAL HIGH (ref 0.1–1.0)
Monocytes Relative: 9 %
Neutro Abs: 9.5 K/uL — ABNORMAL HIGH (ref 1.7–7.7)
Neutrophils Relative %: 69 %
Platelets: 290 K/uL (ref 150–400)
RBC: 3.59 MIL/uL — ABNORMAL LOW (ref 4.22–5.81)
RDW: 21.5 % — ABNORMAL HIGH (ref 11.5–15.5)
WBC: 13.7 K/uL — ABNORMAL HIGH (ref 4.0–10.5)
nRBC: 0.4 % — ABNORMAL HIGH (ref 0.0–0.2)

## 2024-02-14 LAB — COMPREHENSIVE METABOLIC PANEL WITH GFR
ALT: 10 U/L (ref 0–44)
AST: 44 U/L — ABNORMAL HIGH (ref 15–41)
Albumin: 5 g/dL (ref 3.5–5.0)
Alkaline Phosphatase: 145 U/L — ABNORMAL HIGH (ref 38–126)
Anion gap: 15 (ref 5–15)
BUN: <5 mg/dL — ABNORMAL LOW (ref 6–20)
CO2: 21 mmol/L — ABNORMAL LOW (ref 22–32)
Calcium: 9.7 mg/dL (ref 8.9–10.3)
Chloride: 103 mmol/L (ref 98–111)
Creatinine, Ser: 0.67 mg/dL (ref 0.61–1.24)
GFR, Estimated: >60 mL/min
Glucose, Bld: 93 mg/dL (ref 70–99)
Potassium: 4.1 mmol/L (ref 3.5–5.1)
Sodium: 139 mmol/L (ref 135–145)
Total Bilirubin: 3.2 mg/dL — ABNORMAL HIGH (ref 0.0–1.2)
Total Protein: 8.1 g/dL (ref 6.5–8.1)

## 2024-02-14 LAB — RETICULOCYTES
Immature Retic Fract: 26.2 % — ABNORMAL HIGH (ref 2.3–15.9)
RBC.: 3.4 MIL/uL — ABNORMAL LOW (ref 4.22–5.81)
Retic Count, Absolute: 235.5 K/uL — ABNORMAL HIGH (ref 19.0–186.0)
Retic Ct Pct: 6.9 % — ABNORMAL HIGH (ref 0.4–3.1)

## 2024-02-14 MED ORDER — KETOROLAC TROMETHAMINE 15 MG/ML IJ SOLN
15.0000 mg | Freq: Once | INTRAMUSCULAR | Status: AC
  Administered 2024-02-14: 15 mg via INTRAVENOUS
  Filled 2024-02-14: qty 1

## 2024-02-14 MED ORDER — SODIUM CHLORIDE 0.45 % IV SOLN: INTRAVENOUS | Status: DC

## 2024-02-14 MED ORDER — HYDROMORPHONE HCL 1 MG/ML IJ SOLN
1.0000 mg | Freq: Once | INTRAMUSCULAR | Status: AC
  Administered 2024-02-14: 1 mg via INTRAVENOUS
  Filled 2024-02-14: qty 1

## 2024-02-14 MED ORDER — SODIUM CHLORIDE 0.9 % IV BOLUS
1000.0000 mL | Freq: Once | INTRAVENOUS | Status: AC
  Administered 2024-02-14: 1000 mL via INTRAVENOUS

## 2024-02-14 MED ORDER — ONDANSETRON HCL 4 MG/2ML IJ SOLN
4.0000 mg | Freq: Once | INTRAMUSCULAR | Status: AC
  Administered 2024-02-14: 4 mg via INTRAVENOUS
  Filled 2024-02-14: qty 2

## 2024-02-14 NOTE — ED Provider Notes (Addendum)
 " [place] [facility] AT [facility] Provider Note   CSN: [id number] Arrival date & time: 02/14/24  9170     Patient presents with: Sickle Cell Pain Crisis   Rick Jefferson is a 18 y.o. male.   Pt with hx sickle cell disease c/o pain to low back and legs in past day. Tried his home oral med but did not help so much. No trauma to area. No swelling. Denies chest pain or discomfort. No sob or unusual doe. No abd pain or nvd. Normal appetite. No dysuria or gu c/o. No radicular pain. No leg numbness or weakness. No fever or chills. States feels same as his typical sickle cell related pain.   The history is provided by the patient and medical records.  Sickle Cell Pain Crisis Associated symptoms: no chest pain, no cough, no fever, no headaches, no shortness of breath, no sore throat and no vomiting        Prior to Admission medications  Medication Sig Start Date End Date Taking? Authorizing Provider  Acetaminophen  (TYLENOL ) 325 MG CAPS Take 650 mg by mouth every 6 (six) hours as needed (pain). Patient taking differently: Take 650 mg by mouth daily as needed (pain). 07/16/22   Dalkin, William A, MD  folic acid  (FOLVITE ) 1 MG tablet Take 1 mg by mouth daily. 08/03/23 08/02/24  [provider]  hydroxyurea  (HYDREA ) 500 MG capsule Take 1,500 mg by mouth daily. 09/17/21   [provider]  ibuprofen  (ADVIL ) 600 MG tablet Take 1 tablet (600 mg total) by mouth every 6 (six) hours as needed. Patient taking differently: Take 600 mg by mouth daily as needed for mild pain (pain score 1-3) or moderate pain (pain score 4-6). 04/20/22   Hulsman, Donnice PARAS, NP  oxyCODONE  (OXY IR/ROXICODONE ) 5 MG immediate release tablet Take 1 tablet (5 mg total) by mouth daily as needed for severe pain (pain score 7-10) or moderate pain (pain score 4-6). 01/16/24   Sim Emery CROME, MD  polyethylene glycol (MIRALAX  / GLYCOLAX ) 17 g packet Take 17 g by mouth 2 (two) times daily. Patient  taking differently: Take 17 g by mouth daily as needed for moderate constipation. 08/25/20   Arletha High, MD    Allergies: Patient has no known allergies.    Review of Systems  Constitutional:  Negative for chills, diaphoresis and fever.  HENT:  Negative for sore throat.   Respiratory:  Negative for cough and shortness of breath.   Cardiovascular:  Negative for chest pain.  Gastrointestinal:  Negative for abdominal pain, diarrhea and vomiting.  Genitourinary:  Negative for dysuria and flank pain.  Musculoskeletal:  Positive for back pain. Negative for neck pain.  Skin:  Negative for rash.  Neurological:  Negative for weakness, numbness and headaches.    Updated Vital Signs BP 114/66   Pulse (!) 56   Temp 98.4 F (36.9 C) (Oral)   Resp 16   Ht 1.702 m (5' 7)   Wt 69.9 kg   SpO2 99%   BMI 24.14 kg/m   Physical Exam Vitals and nursing note reviewed.  Constitutional:      Appearance: Normal appearance. He is well-developed.  HENT:     Head: Atraumatic.     Nose: Nose normal.     Mouth/Throat:     Mouth: Mucous membranes are moist.     Pharynx: Oropharynx is clear.  Eyes:     General: No scleral icterus.    Conjunctiva/sclera: Conjunctivae normal.  Neck:  Trachea: No tracheal deviation.  Cardiovascular:     Rate and Rhythm: Normal rate and regular rhythm.     Pulses: Normal pulses.     Heart sounds: Normal heart sounds. No murmur heard.    No friction rub. No gallop.  Pulmonary:     Effort: Pulmonary effort is normal. No accessory muscle usage or respiratory distress.     Breath sounds: Normal breath sounds.  Abdominal:     General: Bowel sounds are normal. There is no distension.     Palpations: Abdomen is soft.     Tenderness: There is no abdominal tenderness. There is no guarding.  Genitourinary:    Comments: No cva tenderness. Musculoskeletal:        General: No swelling or tenderness.     Cervical back: Normal range of motion and neck supple. No  rigidity.     Right lower leg: No edema.     Left lower leg: No edema.     Comments: CTLS spine, non tender, aligned, no step off. Good passive rom bil legs without pain. Bil legs are of normal color and warmth with intact distal pulses.   Skin:    General: Skin is warm and dry.     Findings: No rash.  Neurological:     Mental Status: He is alert.     Comments: Alert, speech clear. Motor/sens grossly intact bil.   Psychiatric:        Mood and Affect: Mood normal.     (all labs ordered are listed, but only abnormal results are displayed) Results for orders placed or performed during the hospital encounter of 02/14/24  Comprehensive metabolic panel   Collection Time: 02/14/24  8:52 AM  Result Value Ref Range   Sodium 139 135 - 145 mmol/L   Potassium 4.1 3.5 - 5.1 mmol/L   Chloride 103 98 - 111 mmol/L   CO2 21 (L) 22 - 32 mmol/L   Glucose, Bld 93 70 - 99 mg/dL   BUN <5 (L) 6 - 20 mg/dL   Creatinine, Ser 9.32 0.61 - 1.24 mg/dL   Calcium  9.7 8.9 - 10.3 mg/dL   Total Protein 8.1 6.5 - 8.1 g/dL   Albumin 5.0 3.5 - 5.0 g/dL   AST 44 (H) 15 - 41 U/L   ALT 10 0 - 44 U/L   Alkaline Phosphatase 145 (H) 38 - 126 U/L   Total Bilirubin 3.2 (H) 0.0 - 1.2 mg/dL   GFR, Estimated >39 >39 mL/min   Anion gap 15 5 - 15  CBC with Differential   Collection Time: 02/14/24  8:52 AM  Result Value Ref Range   WBC 13.7 (H) 4.0 - 10.5 K/uL   RBC 3.59 (L) 4.22 - 5.81 MIL/uL   Hemoglobin 11.0 (L) 13.0 - 17.0 g/dL   HCT 69.4 (L) 60.9 - 47.9 %   MCV 85.0 80.0 - 100.0 fL   MCH 30.6 26.0 - 34.0 pg   MCHC 36.1 (H) 30.0 - 36.0 g/dL   RDW 78.4 (H) 88.4 - 84.4 %   Platelets 290 150 - 400 K/uL   nRBC 0.4 (H) 0.0 - 0.2 %   Neutrophils Relative % 69 %   Neutro Abs 9.5 (H) 1.7 - 7.7 K/uL   Lymphocytes Relative 19 %   Lymphs Abs 2.6 0.7 - 4.0 K/uL   Monocytes Relative 9 %   Monocytes Absolute 1.2 (H) 0.1 - 1.0 K/uL   Eosinophils Relative 0 %   Eosinophils Absolute 0.0 0.0 -  0.5 K/uL   Basophils Relative  3 %   Basophils Absolute 0.4 (H) 0.0 - 0.1 K/uL   WBC Morphology See Note    Smear Review See Note    Tear Drop Cells PRESENT    Polychromasia PRESENT    Sickle Cells PRESENT    Target Cells PRESENT   Reticulocytes   Collection Time: 02/14/24  8:52 AM  Result Value Ref Range   Retic Ct Pct 6.9 (H) 0.4 - 3.1 %   RBC. 3.40 (L) 4.22 - 5.81 MIL/uL   Retic Count, Absolute 235.5 (H) 19.0 - 186.0 K/uL   Immature Retic Fract 26.2 (H) 2.3 - 15.9 %     EKG: None  Radiology: No results found.   Procedures   Medications Ordered in the ED  0.45 % sodium chloride  infusion ( Intravenous New Bag/Given 02/14/24 1134)  HYDROmorphone  (DILAUDID ) injection 1 mg (1 mg Intravenous Given 02/14/24 0902)  HYDROmorphone  (DILAUDID ) injection 1 mg (1 mg Intravenous Given 02/14/24 0939)  sodium chloride  0.9 % bolus 1,000 mL (0 mLs Intravenous Stopped 02/14/24 1127)  ketorolac  (TORADOL ) 15 MG/ML injection 15 mg (15 mg Intravenous Given 02/14/24 1030)  HYDROmorphone  (DILAUDID ) injection 1 mg (1 mg Intravenous Given 02/14/24 1030)  HYDROmorphone  (DILAUDID ) injection 1 mg (1 mg Intravenous Given 02/14/24 1128)  ondansetron  (ZOFRAN ) injection 4 mg (4 mg Intravenous Given 02/14/24 1230)  HYDROmorphone  (DILAUDID ) injection 1 mg (1 mg Intravenous Given 02/14/24 1318)                                    Medical Decision Making Problems Addressed: Sickle cell pain crisis (HCC): acute illness or injury with systemic symptoms that poses a threat to life or bodily functions  Amount and/or Complexity of Data Reviewed External Data Reviewed: notes. Labs: ordered. Decision-making details documented in ED Course.  Risk Prescription drug management. Parenteral controlled substances. Decision regarding hospitalization.   Iv ns. Labs ordered/sent.  Differential diagnosis includes sickle cell crisis, chronic pain syndrome, etc. Dispo decision including potential need for admission considered - will get labs   and reassess.   Reviewed nursing notes and prior charts for additional history. External reports reviewed.   LR bolus. Dilaudid  iv, toradol  iv.   Labs reviewed/interpreted by me - wbc 13, hgb 11.   Pain improved, but persists. Additional meds given for symptom improvement.   Recheck pt, pt is comfortable, no acute distress, vitals normal - pt currently appears stable for ED d/c.   Rec close pcp f/u.  Return precautions provided.       Final diagnoses:  Sickle cell pain crisis [facility])    ED Discharge Orders     None            Bernard Drivers, MD 02/14/24 1402  "

## 2024-02-14 NOTE — ED Triage Notes (Signed)
 Generalized body pain severe in the back.  Reports sickle cell crisis.  Last flare was in September. Patient appears in significant pain.

## 2024-02-14 NOTE — ED Provider Triage Note (Signed)
 Emergency Medicine Provider Triage Evaluation Note  Rick Jefferson , a 18 y.o. male  was evaluated in triage.  Pt complains of sickle cell crisis.  He states this feels typical to his last episode.  Has bilateral leg and back pain.  Last took oxycodone  at 5 AM this morning.  Review of Systems  Positive: Bilateral leg and back pain Negative: Shortness of breath, cough, chest pain  Physical Exam  BP 127/74   Pulse 98   Temp 97.6 F (36.4 C)   Resp 18   Ht 5' 7 (1.702 m)   Wt 69.9 kg   SpO2 100%   BMI 24.14 kg/m  Gen:   Awake, appears ill-appearing and in pain Resp:  Normal effort  MSK:   Moves extremities without difficulty  Other:    Medical Decision Making  Medically screening exam initiated at 9:00 AM.  Appropriate orders placed.  Rick Jefferson was informed that the remainder of the evaluation will be completed by another provider, this initial triage assessment does not replace that evaluation, and the importance of remaining in the ED until their evaluation is complete.  CBC, CMP, reticulocytes, Dilaudid    [name], PA-C 02/14/24 0901

## 2024-02-14 NOTE — Discharge Instructions (Signed)
 It was our pleasure to provide your ER care today - we hope that you feel better.  Drink plenty of fluids/stay well hydrated. Take your pain medication as need - no driving for the next 8 hours, or if/when taking opiate-type of pain medication.    Follow up closely with primary care doctor in the next 1-2 days if symptoms fail to improve/resolve.  Return to ER if worse, new symptoms, fevers, trouble breathing, intractable pain, persistent vomiting, chest pain, fainting, or other concern.

## 2024-02-15 ENCOUNTER — Emergency Department (HOSPITAL_COMMUNITY)

## 2024-02-15 ENCOUNTER — Inpatient Hospital Stay (HOSPITAL_COMMUNITY)
Admission: EM | Admit: 2024-02-15 | Discharge: 2024-02-21 | DRG: 812 | Disposition: A | Discharge status: Home / Self Care | Attending: Internal Medicine | Admitting: Internal Medicine

## 2024-02-15 ENCOUNTER — Encounter (HOSPITAL_COMMUNITY): Payer: Self-pay

## 2024-02-15 ENCOUNTER — Other Ambulatory Visit: Payer: Self-pay

## 2024-02-15 DIAGNOSIS — D638 Anemia in other chronic diseases classified elsewhere: Secondary | ICD-10-CM | POA: Diagnosis present

## 2024-02-15 DIAGNOSIS — G894 Chronic pain syndrome: Secondary | ICD-10-CM | POA: Diagnosis present

## 2024-02-15 DIAGNOSIS — Z79899 Other long term (current) drug therapy: Secondary | ICD-10-CM

## 2024-02-15 DIAGNOSIS — M25561 Pain in right knee: Secondary | ICD-10-CM | POA: Diagnosis present

## 2024-02-15 DIAGNOSIS — G8929 Other chronic pain: Secondary | ICD-10-CM | POA: Insufficient documentation

## 2024-02-15 DIAGNOSIS — D72829 Elevated white blood cell count, unspecified: Secondary | ICD-10-CM | POA: Diagnosis present

## 2024-02-15 DIAGNOSIS — D5700 Hb-SS disease with crisis, unspecified: Secondary | ICD-10-CM | POA: Diagnosis not present

## 2024-02-15 DIAGNOSIS — E872 Acidosis, unspecified: Secondary | ICD-10-CM | POA: Diagnosis present

## 2024-02-15 LAB — URINALYSIS, ROUTINE W REFLEX MICROSCOPIC
Bilirubin Urine: NEGATIVE
Glucose, UA: NEGATIVE mg/dL
Hgb urine dipstick: NEGATIVE
Ketones, ur: 5 mg/dL — AB
Leukocytes,Ua: NEGATIVE
Nitrite: NEGATIVE
Protein, ur: NEGATIVE mg/dL
Specific Gravity, Urine: 1.013 (ref 1.005–1.030)
pH: 6 (ref 5.0–8.0)

## 2024-02-15 LAB — CBC WITH DIFFERENTIAL/PLATELET
Abs Immature Granulocytes: 0.1 K/uL — ABNORMAL HIGH (ref 0.00–0.07)
Basophils Absolute: 0.1 K/uL (ref 0.0–0.1)
Basophils Relative: 1 %
Eosinophils Absolute: 0 K/uL (ref 0.0–0.5)
Eosinophils Relative: 0 %
HCT: 29.3 % — ABNORMAL LOW (ref 39.0–52.0)
Hemoglobin: 10.5 g/dL — ABNORMAL LOW (ref 13.0–17.0)
Immature Granulocytes: 1 %
Lymphocytes Relative: 13 %
Lymphs Abs: 1.5 K/uL (ref 0.7–4.0)
MCH: 31 pg (ref 26.0–34.0)
MCHC: 35.8 g/dL (ref 30.0–36.0)
MCV: 86.4 fL (ref 80.0–100.0)
Monocytes Absolute: 1.6 K/uL — ABNORMAL HIGH (ref 0.1–1.0)
Monocytes Relative: 13 %
Neutro Abs: 8.9 K/uL — ABNORMAL HIGH (ref 1.7–7.7)
Neutrophils Relative %: 72 %
Platelets: 235 K/uL (ref 150–400)
RBC: 3.39 MIL/uL — ABNORMAL LOW (ref 4.22–5.81)
RDW: 21.8 % — ABNORMAL HIGH (ref 11.5–15.5)
WBC: 12.2 K/uL — ABNORMAL HIGH (ref 4.0–10.5)
nRBC: 0.7 % — ABNORMAL HIGH (ref 0.0–0.2)

## 2024-02-15 LAB — BASIC METABOLIC PANEL WITH GFR
Anion gap: 13 (ref 5–15)
BUN: 6 mg/dL (ref 6–20)
CO2: 21 mmol/L — ABNORMAL LOW (ref 22–32)
Calcium: 9.6 mg/dL (ref 8.9–10.3)
Chloride: 102 mmol/L (ref 98–111)
Creatinine, Ser: 0.55 mg/dL — ABNORMAL LOW (ref 0.61–1.24)
GFR, Estimated: >60 mL/min
Glucose, Bld: 91 mg/dL (ref 70–99)
Potassium: 4 mmol/L (ref 3.5–5.1)
Sodium: 136 mmol/L (ref 135–145)

## 2024-02-15 LAB — RETICULOCYTES
Immature Retic Fract: 19.2 % — ABNORMAL HIGH (ref 2.3–15.9)
RBC.: 0.85 MIL/uL — ABNORMAL LOW (ref 4.22–5.81)
Retic Count, Absolute: 103.6 K/uL (ref 19.0–186.0)
Retic Ct Pct: 12.2 % — ABNORMAL HIGH (ref 0.4–3.1)

## 2024-02-15 MED ORDER — DIPHENHYDRAMINE HCL 25 MG PO CAPS: 25.0000 mg | ORAL_CAPSULE | ORAL | Status: DC | PRN

## 2024-02-15 MED ORDER — HYDROMORPHONE HCL 1 MG/ML IJ SOLN
1.0000 mg | INTRAMUSCULAR | Status: AC
  Administered 2024-02-15: 1 mg via INTRAVENOUS
  Filled 2024-02-15: qty 1

## 2024-02-15 MED ORDER — SODIUM CHLORIDE 0.45 % IV SOLN: INTRAVENOUS | Status: AC

## 2024-02-15 MED ORDER — SODIUM CHLORIDE 0.9 % IV SOLN
12.5000 mg | Freq: Once | INTRAVENOUS | Status: AC
  Administered 2024-02-15: 12.5 mg via INTRAVENOUS
  Filled 2024-02-15: qty 12.5

## 2024-02-15 MED ORDER — KETOROLAC TROMETHAMINE 15 MG/ML IJ SOLN
15.0000 mg | INTRAMUSCULAR | Status: AC
  Administered 2024-02-15: 15 mg via INTRAVENOUS
  Filled 2024-02-15: qty 1

## 2024-02-15 MED ORDER — ONDANSETRON HCL 4 MG/2ML IJ SOLN
4.0000 mg | INTRAMUSCULAR | Status: DC | PRN
  Administered 2024-02-15 – 2024-02-17 (×4): 4 mg via INTRAVENOUS
  Filled 2024-02-15 (×4): qty 2

## 2024-02-15 MED ORDER — MELATONIN 5 MG PO TABS: 5.0000 mg | ORAL_TABLET | Freq: Every evening | ORAL | Status: DC | PRN

## 2024-02-15 MED ORDER — NALOXONE HCL 0.4 MG/ML IJ SOLN: 0.4000 mg | INTRAMUSCULAR | Status: DC | PRN

## 2024-02-15 MED ORDER — ENOXAPARIN SODIUM 40 MG/0.4ML IJ SOSY
40.0000 mg | PREFILLED_SYRINGE | INTRAMUSCULAR | Status: DC
  Administered 2024-02-15 – 2024-02-20 (×6): 40 mg via SUBCUTANEOUS
  Filled 2024-02-15 (×6): qty 0.4

## 2024-02-15 MED ORDER — SODIUM CHLORIDE 0.9% FLUSH: 9.0000 mL | INTRAVENOUS | Status: DC | PRN

## 2024-02-15 MED ORDER — HYDROMORPHONE HCL 1 MG/ML IJ SOLN
1.0000 mg | INTRAMUSCULAR | Status: AC | PRN
  Administered 2024-02-15 – 2024-02-16 (×6): 1 mg via INTRAVENOUS
  Filled 2024-02-15 (×6): qty 1

## 2024-02-15 MED ORDER — HYDROMORPHONE HCL 1 MG/ML IJ SOLN
0.5000 mg | INTRAMUSCULAR | Status: AC
  Administered 2024-02-15: 0.5 mg via INTRAVENOUS
  Filled 2024-02-15: qty 1

## 2024-02-15 MED ORDER — ACETAMINOPHEN 500 MG PO TABS
500.0000 mg | ORAL_TABLET | Freq: Four times a day (QID) | ORAL | Status: AC | PRN
  Administered 2024-02-16 – 2024-02-18 (×4): 500 mg via ORAL
  Filled 2024-02-15 (×4): qty 1

## 2024-02-15 MED ORDER — POLYETHYLENE GLYCOL 3350 17 G PO PACK
17.0000 g | PACK | Freq: Every day | ORAL | Status: DC | PRN
  Administered 2024-02-17: 17 g via ORAL
  Filled 2024-02-15 (×2): qty 1

## 2024-02-15 MED ORDER — OXYCODONE HCL 5 MG PO TABS
5.0000 mg | ORAL_TABLET | ORAL | Status: DC | PRN
  Administered 2024-02-16 (×3): 10 mg via ORAL
  Filled 2024-02-15 (×3): qty 2

## 2024-02-15 MED ORDER — HYDROMORPHONE HCL 1 MG/ML IJ SOLN
1.0000 mg | Freq: Once | INTRAMUSCULAR | Status: AC
  Administered 2024-02-15: 1 mg via INTRAMUSCULAR
  Filled 2024-02-15: qty 1

## 2024-02-15 MED ORDER — HYDROMORPHONE 1 MG/ML IV SOLN
INTRAVENOUS | Status: DC
  Administered 2024-02-16: 30 mg via INTRAVENOUS
  Administered 2024-02-16: 1 mg via INTRAVENOUS
  Filled 2024-02-15: qty 30

## 2024-02-15 MED ORDER — FOLIC ACID 1 MG PO TABS
1.0000 mg | ORAL_TABLET | Freq: Every day | ORAL | Status: DC
  Administered 2024-02-15 – 2024-02-21 (×7): 1 mg via ORAL
  Filled 2024-02-15 (×7): qty 1

## 2024-02-15 NOTE — ED Provider Triage Note (Cosign Needed Addendum)
 Emergency Medicine Provider Triage Evaluation Note  Rick Jefferson , a 18 y.o. male  was evaluated in triage.  Pt complains of sickle cell pain. She reports R>L leg pain which is typical for her crises. No fever, CP, SOB, nausea, vomiting. Seen yesterday and feels she was not controlled on discharge.   Review of Systems  Positive:  Negative:   Physical Exam  BP (!) 147/77 (BP Location: Right Arm)   Pulse 84   Temp 100.1 F (37.8 C) (Oral)   Resp 18   Ht 5' 7 (1.702 m)   Wt 66.7 kg   SpO2 95%   BMI 23.02 kg/m  Gen:   Awake, no distress   Resp:  Normal effort  MSK:   Moves extremities without difficulty  Other:    Medical Decision Making  Medically screening exam initiated at 11:12 AM.  Appropriate orders placed.  Rick Jefferson was informed that the remainder of the evaluation will be completed by another provider, this initial triage assessment does not replace that evaluation, and the importance of remaining in the ED until their evaluation is complete.     Rick Balls, PA-C 02/15/24 1113  11:50:  ADDENDUM: discussed day hospital admission with Rick Jefferson. Advises to give her 3 doses of protocol pain medication and re-evaluate need for admission.     Rick Balls, PA-C 02/15/24 1156

## 2024-02-15 NOTE — ED Triage Notes (Signed)
 SCC that started yesterday, pain in lower back and legs, right leg is worse than left. Last had oxycodone  and tylenol  at 0900 today.

## 2024-02-15 NOTE — ED Provider Notes (Signed)
 " [place] EMERGENCY DEPARTMENT AT [facility] Provider Note   CSN: [id number] Arrival date & time: 02/15/24  1003     Patient presents with: Sickle Cell Pain Crisis   Rick Jefferson is a 18 y.o. male.   18 year old male with prior medical history as detailed below presents for evaluation.  Patient complains of continued sickle cell pain.  Pain is primarily in the low back and bilateral legs.  Pain described as typical of prior sickle cell crises.  Patient attempted to take oxycodone  at home without improvement or control her pain.  Patient was seen yesterday for same complaint and treated and then released from the [facility] ED.  Patient returns today with complaint of persistent uncontrolled pain.  No reported fever.  No reported chest pain or shortness of breath.  The history is provided by the patient and medical records.       Prior to Admission medications  Medication Sig Start Date End Date Taking? Authorizing Provider  Acetaminophen  (TYLENOL ) 325 MG CAPS Take 650 mg by mouth every 6 (six) hours as needed (pain). Patient taking differently: Take 650 mg by mouth daily as needed (pain). 07/16/22   Dalkin, William A, MD  folic acid  (FOLVITE ) 1 MG tablet Take 1 mg by mouth daily. 08/03/23 08/02/24  [provider]  hydroxyurea  (HYDREA ) 500 MG capsule Take 1,500 mg by mouth daily. 09/17/21   [provider]  ibuprofen  (ADVIL ) 600 MG tablet Take 1 tablet (600 mg total) by mouth every 6 (six) hours as needed. Patient taking differently: Take 600 mg by mouth daily as needed for mild pain (pain score 1-3) or moderate pain (pain score 4-6). 04/20/22   Hulsman, Donnice PARAS, NP  oxyCODONE  (OXY IR/ROXICODONE ) 5 MG immediate release tablet Take 1 tablet (5 mg total) by mouth daily as needed for severe pain (pain score 7-10) or moderate pain (pain score 4-6). 01/16/24   Sim Emery CROME, MD  polyethylene glycol (MIRALAX  / GLYCOLAX ) 17 g packet Take 17 g by mouth 2 (two)  times daily. Patient taking differently: Take 17 g by mouth daily as needed for moderate constipation. 08/25/20   Arletha High, MD    Allergies: Patient has no known allergies.    Review of Systems  All other systems reviewed and are negative.   Updated Vital Signs BP (!) 148/92 (BP Location: Right Arm)   Pulse 88   Temp 98.3 F (36.8 C) (Oral)   Resp 18   Ht 5' 7 (1.702 m)   Wt 66.7 kg   SpO2 98%   BMI 23.02 kg/m   Physical Exam Vitals and nursing note reviewed.  Constitutional:      General: He is not in acute distress.    Appearance: He is well-developed.  HENT:     Head: Normocephalic and atraumatic.  Eyes:     Conjunctiva/sclera: Conjunctivae normal.  Cardiovascular:     Rate and Rhythm: Normal rate and regular rhythm.     Heart sounds: No murmur heard. Pulmonary:     Effort: Pulmonary effort is normal. No respiratory distress.     Breath sounds: Normal breath sounds.  Abdominal:     Palpations: Abdomen is soft.     Tenderness: There is no abdominal tenderness.  Musculoskeletal:        General: No swelling.     Cervical back: Neck supple.  Skin:    General: Skin is warm and dry.     Capillary Refill: Capillary refill takes less  than 2 seconds.  Neurological:     Mental Status: He is alert.  Psychiatric:        Mood and Affect: Mood normal.     (all labs ordered are listed, but only abnormal results are displayed) Labs Reviewed  CBC WITH DIFFERENTIAL/PLATELET - Abnormal; Notable for the following components:      Result Value   WBC 12.2 (*)    RBC 3.39 (*)    Hemoglobin 10.5 (*)    HCT 29.3 (*)    RDW 21.8 (*)    nRBC 0.7 (*)    Neutro Abs 8.9 (*)    Monocytes Absolute 1.6 (*)    Abs Immature Granulocytes 0.10 (*)    All other components within normal limits  RETICULOCYTES - Abnormal; Notable for the following components:   Retic Ct Pct 12.2 (*)    RBC. 0.85 (*)    Immature Retic Fract 19.2 (*)    All other components within normal limits   BASIC METABOLIC PANEL WITH GFR - Abnormal; Notable for the following components:   CO2 21 (*)    Creatinine, Ser 0.55 (*)    All other components within normal limits  URINALYSIS, ROUTINE W REFLEX MICROSCOPIC - Abnormal; Notable for the following components:   Ketones, ur 5 (*)    All other components within normal limits    EKG: EKG Interpretation Date/Time:  Monday February 15 2024 18:54:00 EST Ventricular Rate:  79 PR Interval:  136 QRS Duration:  86 QT Interval:  374 QTC Calculation: 428 R Axis:   49  Text Interpretation: Sinus rhythm with Premature atrial complexes Otherwise normal ECG When compared with ECG of 08-Jan-2024 23:14, PREVIOUS ECG IS PRESENT Confirmed by Laurice Coy [phone]) on 02/15/2024 6:55:04 PM  Radiology: [facility] Chest 2 View Result Date: 02/15/2024 CLINICAL DATA:  Fever.  Sickle cell crisis. EXAM: CHEST - 2 VIEW COMPARISON:  Radiograph 01/09/2024, CT 01/10/2024 FINDINGS: Mild cardiomegaly is stable.The cardiomediastinal contours are normal. Subsegmental atelectasis/scarring in the lung bases. Pulmonary vasculature is normal. No consolidation, pleural effusion, or pneumothorax. No acute osseous abnormalities are seen. Avascular necrosis of the right humeral head. IMPRESSION: 1. Mild cardiomegaly, unchanged. 2. Subsegmental atelectasis/scarring in the lung bases. Electronically Signed   By: Andrea Gasman M.D.   On: 02/15/2024 18:47     Procedures   Medications Ordered in the ED  0.45 % sodium chloride  infusion ( Intravenous New Bag/Given 02/15/24 1723)  ondansetron  (ZOFRAN ) injection 4 mg (4 mg Intravenous Given 02/15/24 1751)  HYDROmorphone  (DILAUDID ) injection 1 mg (1 mg Intramuscular Given 02/15/24 1146)  ketorolac  (TORADOL ) 15 MG/ML injection 15 mg (15 mg Intravenous Given 02/15/24 1713)  HYDROmorphone  (DILAUDID ) injection 0.5 mg (0.5 mg Intravenous Given 02/15/24 1704)  HYDROmorphone  (DILAUDID ) injection 1 mg (1 mg Intravenous Given 02/15/24 1751)   HYDROmorphone  (DILAUDID ) injection 1 mg (1 mg Intravenous Given 02/15/24 1843)  diphenhydrAMINE  (BENADRYL ) 12.5 mg in sodium chloride  0.9 % 50 mL IVPB (0 mg Intravenous Stopped 02/15/24 1747)                                    Medical Decision Making Patient presents with complaint of persistent sickle cell pain.  Patient reports that chronic pain medications used at home did not control acute painful crisis.  Patient was seen yesterday at the [facility], ED for same complaint.  After treatment patient was discharged.  Patient returns now with complaint of  continued uncontrolled pain.  Screening labs and imaging are without significant acute abnormality.  Patient is reporting uncontrolled pain after 4 rounds of Dilaudid .  Patient requests admission.  Hospitalist service made aware of case and will evaluate for admission.  Amount and/or Complexity of Data Reviewed Labs: ordered. Radiology: ordered.  Risk Prescription drug management.        Final diagnoses:  Sickle cell pain crisis [facility])    ED Discharge Orders     None          Laurice Maude BROCKS, MD 02/15/24 1905  "

## 2024-02-15 NOTE — H&P (Addendum)
 " History and Physical  Rick Jefferson FMW:[id number] DOB: 09-05-2005 DOA: 02/15/2024  Referring physician: Dr. Laurice, EDP  PCP: Pcp, No  Outpatient Specialists: Sickle cell clinic Patient coming from: Home  Chief Complaint: Sickle cell pain crisis.  HPI: Rick Jefferson is a 18 y.o. male with medical history significant for sickle cell disease who presented to the ER with complaints of low back pain and legs pain for the past 2 days.  The pain is not improved with home oral opioids.  States symptoms are similar to prior sickle cell pain crisis.  He presented to the ER yesterday for the same.  He was treated in the ER with IV opiate-based analgesics and IV fluids.  He felt better and went home.  At home, his pain recurred and was not controlled with his home oxycodone .  No subjective fevers or chills.  No chest pain.  In the ER, requiring multiple rounds of IV opiate-based analgesics but still in pain.  Chest x-ray is nonacute.  The patient received IV Dilaudid  1 mg x 4, IV Benadryl  12.5 mg x 1, IV Toradol  15 mg x 1, and IV fluid.  Due to persistent symptomatology, EDP requested admission for further management of sickle cell pain crisis.  Admitted by [facility], hospitalist service.  ED Course: Temperature 99.1.  BP 122/90, pulse 108, respiratory rate 16, O2 saturation 94% on room air.  Review of Systems: Review of systems as noted in the HPI. All other systems reviewed and are negative.   Past Medical History:  Diagnosis Date   Asthma    Sickle cell anemia (HCC)    Past Surgical History:  Procedure Laterality Date   CHOLECYSTECTOMY     TONSILLECTOMY     TYMPANOSTOMY TUBE PLACEMENT      Social History:  reports that he has never smoked. He has been exposed to tobacco smoke. He has never used smokeless tobacco. He reports that he does not drink alcohol and does not use drugs.   Allergies[1]  Family history: None reported.  Prior to Admission medications  Medication Sig Start  Date End Date Taking? Authorizing Provider  Acetaminophen  (TYLENOL ) 325 MG CAPS Take 650 mg by mouth every 6 (six) hours as needed (pain). Patient taking differently: Take 650 mg by mouth daily as needed (pain). 07/16/22   Dalkin, William A, MD  folic acid  (FOLVITE ) 1 MG tablet Take 1 mg by mouth daily. 08/03/23 08/02/24  [provider]  hydroxyurea  (HYDREA ) 500 MG capsule Take 1,500 mg by mouth daily. 09/17/21   [provider]  ibuprofen  (ADVIL ) 600 MG tablet Take 1 tablet (600 mg total) by mouth every 6 (six) hours as needed. Patient taking differently: Take 600 mg by mouth daily as needed for mild pain (pain score 1-3) or moderate pain (pain score 4-6). 04/20/22   Hulsman, Donnice PARAS, NP  oxyCODONE  (OXY IR/ROXICODONE ) 5 MG immediate release tablet Take 1 tablet (5 mg total) by mouth daily as needed for severe pain (pain score 7-10) or moderate pain (pain score 4-6). 01/16/24   Sim Emery CROME, MD  polyethylene glycol (MIRALAX  / GLYCOLAX ) 17 g packet Take 17 g by mouth 2 (two) times daily. Patient taking differently: Take 17 g by mouth daily as needed for moderate constipation. 08/25/20   Arletha High, MD    Physical Exam: BP (!) 122/90 (BP Location: Left Arm)   Pulse (!) 108   Temp 99.1 F (37.3 C) (Oral)   Resp 16   Ht 5' 7 (1.702 m)  Wt 66.7 kg   SpO2 94%   BMI 23.02 kg/m   General: 18 y.o. year-old male well developed well nourished in no acute distress.  Alert and oriented x3. Cardiovascular: Regular rate and rhythm with no rubs or gallops.  No thyromegaly or JVD noted.  No lower extremity edema. 2/4 pulses in all 4 extremities. Respiratory: Clear to auscultation with no wheezes or rales. Good inspiratory effort. Abdomen: Soft nontender nondistended with normal bowel sounds x4 quadrants. Muskuloskeletal: No cyanosis, clubbing or edema noted bilaterally Neuro: CN II-XII intact, strength, sensation, reflexes Skin: No ulcerative lesions noted or rashes Psychiatry:  Judgement and insight appear normal. Mood is appropriate for condition and setting          Labs on Admission:  Basic Metabolic Panel: Recent Labs  Lab 02/14/24 0852 02/15/24 1702  NA 139 136  K 4.1 4.0  CL 103 102  CO2 21* 21*  GLUCOSE 93 91  BUN <5* 6  CREATININE 0.67 0.55*  CALCIUM  9.7 9.6   Liver Function Tests: Recent Labs  Lab 02/14/24 0852  AST 44*  ALT 10  ALKPHOS 145*  BILITOT 3.2*  PROT 8.1  ALBUMIN 5.0   No results for input(s): LIPASE, AMYLASE in the last 168 hours. No results for input(s): AMMONIA in the last 168 hours. CBC: Recent Labs  Lab 02/14/24 0852 02/15/24 1702  WBC 13.7* 12.2*  NEUTROABS 9.5* 8.9*  HGB 11.0* 10.5*  HCT 30.5* 29.3*  MCV 85.0 86.4  PLT 290 235   Cardiac Enzymes: No results for input(s): CKTOTAL, CKMB, CKMBINDEX, TROPONINI in the last 168 hours.  BNP (last 3 results) No results for input(s): BNP in the last 8760 hours.  ProBNP (last 3 results) No results for input(s): PROBNP in the last 8760 hours.  CBG: No results for input(s): GLUCAP in the last 168 hours.  Radiological Exams on Admission: DG Chest 2 View Result Date: 02/15/2024 CLINICAL DATA:  Fever.  Sickle cell crisis. EXAM: CHEST - 2 VIEW COMPARISON:  Radiograph 01/09/2024, CT 01/10/2024 FINDINGS: Mild cardiomegaly is stable.The cardiomediastinal contours are normal. Subsegmental atelectasis/scarring in the lung bases. Pulmonary vasculature is normal. No consolidation, pleural effusion, or pneumothorax. No acute osseous abnormalities are seen. Avascular necrosis of the right humeral head. IMPRESSION: 1. Mild cardiomegaly, unchanged. 2. Subsegmental atelectasis/scarring in the lung bases. Electronically Signed   By: Andrea Gasman M.D.   On: 02/15/2024 18:47    EKG: I independently viewed the EKG done and my findings are as followed: Sinus rhythm rate of 79.  QTc 428.  Assessment/Plan Present on Admission:  Sickle cell pain crisis  (HCC)  Principal Problem:   Sickle cell pain crisis (HCC)  Sickle cell pain crisis Continue supportive care Pain control, PCA pump Dilaudid  IV fluid, half-normal saline at 100 cc/h x 1 day Folic acid  supplement IV antiemetics as needed  Mild non-anion gap metabolic acidosis Serum bicarb 21, anion gap 13 Continue IV fluid hydration Repeat BMP in the morning  Leukocytosis, suspect reactive in the setting of sickle cell pain crisis Nonseptic appearing WBC 12.2, downtrending without antibiotics from yesterday 13.7, when seen in the ER Continue to monitor.   Time: 75 minutes.   DVT prophylaxis: Subcu Lovenox  daily.  Code Status: Full code.  Family Communication: None at bedside.  Disposition Plan: Admitted to telemetry unit.  Consults called: Sickle cell team  Admission status: Observation status   Status is: Observation    Terry LOISE Hurst MD [facility] Pager [phone]  If 7PM-7AM, please  contact night-coverage [email or web address] Password [email or web address]  02/15/2024, 7:41 PM      [1] No Known Allergies  "

## 2024-02-16 DIAGNOSIS — D72829 Elevated white blood cell count, unspecified: Secondary | ICD-10-CM | POA: Diagnosis present

## 2024-02-16 DIAGNOSIS — Z79899 Other long term (current) drug therapy: Secondary | ICD-10-CM | POA: Diagnosis not present

## 2024-02-16 DIAGNOSIS — D5700 Hb-SS disease with crisis, unspecified: Secondary | ICD-10-CM | POA: Diagnosis present

## 2024-02-16 DIAGNOSIS — G8929 Other chronic pain: Secondary | ICD-10-CM | POA: Insufficient documentation

## 2024-02-16 DIAGNOSIS — E872 Acidosis, unspecified: Secondary | ICD-10-CM | POA: Diagnosis present

## 2024-02-16 DIAGNOSIS — G894 Chronic pain syndrome: Secondary | ICD-10-CM | POA: Diagnosis present

## 2024-02-16 DIAGNOSIS — D638 Anemia in other chronic diseases classified elsewhere: Secondary | ICD-10-CM | POA: Insufficient documentation

## 2024-02-16 DIAGNOSIS — M25561 Pain in right knee: Secondary | ICD-10-CM | POA: Diagnosis present

## 2024-02-16 LAB — COMPREHENSIVE METABOLIC PANEL WITH GFR
ALT: 45 U/L — ABNORMAL HIGH (ref 0–44)
AST: 90 U/L — ABNORMAL HIGH (ref 15–41)
Albumin: 4.8 g/dL (ref 3.5–5.0)
Alkaline Phosphatase: 172 U/L — ABNORMAL HIGH (ref 38–126)
Anion gap: 12 (ref 5–15)
BUN: <5 mg/dL — ABNORMAL LOW (ref 6–20)
CO2: 24 mmol/L (ref 22–32)
Calcium: 9.7 mg/dL (ref 8.9–10.3)
Chloride: 102 mmol/L (ref 98–111)
Creatinine, Ser: 0.56 mg/dL — ABNORMAL LOW (ref 0.61–1.24)
GFR, Estimated: >60 mL/min
Glucose, Bld: 99 mg/dL (ref 70–99)
Potassium: 4 mmol/L (ref 3.5–5.1)
Sodium: 138 mmol/L (ref 135–145)
Total Bilirubin: 6.2 mg/dL — ABNORMAL HIGH (ref 0.0–1.2)
Total Protein: 7.9 g/dL (ref 6.5–8.1)

## 2024-02-16 LAB — CBC WITH DIFFERENTIAL/PLATELET
Abs Immature Granulocytes: 0.08 K/uL — ABNORMAL HIGH (ref 0.00–0.07)
Basophils Absolute: 0.1 K/uL (ref 0.0–0.1)
Basophils Relative: 1 %
Eosinophils Absolute: 0.1 K/uL (ref 0.0–0.5)
Eosinophils Relative: 1 %
HCT: 28.7 % — ABNORMAL LOW (ref 39.0–52.0)
Hemoglobin: 10.3 g/dL — ABNORMAL LOW (ref 13.0–17.0)
Immature Granulocytes: 1 %
Lymphocytes Relative: 24 %
Lymphs Abs: 2.6 K/uL (ref 0.7–4.0)
MCH: 30.8 pg (ref 26.0–34.0)
MCHC: 35.9 g/dL (ref 30.0–36.0)
MCV: 85.9 fL (ref 80.0–100.0)
Monocytes Absolute: 1.6 K/uL — ABNORMAL HIGH (ref 0.1–1.0)
Monocytes Relative: 15 %
Neutro Abs: 6.3 K/uL (ref 1.7–7.7)
Neutrophils Relative %: 58 %
Platelets: 228 K/uL (ref 150–400)
RBC: 3.34 MIL/uL — ABNORMAL LOW (ref 4.22–5.81)
RDW: 21 % — ABNORMAL HIGH (ref 11.5–15.5)
WBC: 10.8 K/uL — ABNORMAL HIGH (ref 4.0–10.5)
nRBC: 0.9 % — ABNORMAL HIGH (ref 0.0–0.2)

## 2024-02-16 LAB — PHOSPHORUS: Phosphorus: 4 mg/dL (ref 2.5–4.6)

## 2024-02-16 LAB — MAGNESIUM: Magnesium: 1.9 mg/dL (ref 1.7–2.4)

## 2024-02-16 MED ORDER — HYDROXYUREA 500 MG PO CAPS
1500.0000 mg | ORAL_CAPSULE | Freq: Every day | ORAL | Status: DC
  Administered 2024-02-16 – 2024-02-21 (×6): 1500 mg via ORAL
  Filled 2024-02-16 (×6): qty 3

## 2024-02-16 MED ORDER — OXYCODONE HCL 5 MG PO TABS
5.0000 mg | ORAL_TABLET | Freq: Every day | ORAL | Status: DC | PRN
  Administered 2024-02-17 – 2024-02-19 (×3): 5 mg via ORAL
  Filled 2024-02-16 (×3): qty 1

## 2024-02-16 MED ORDER — HYDROMORPHONE 1 MG/ML IV SOLN
INTRAVENOUS | Status: DC
  Administered 2024-02-16: 5 mg via INTRAVENOUS
  Administered 2024-02-17: 1.6 mg via INTRAVENOUS
  Administered 2024-02-17: 5 mg via INTRAVENOUS
  Administered 2024-02-17: 1.6 mg via INTRAVENOUS
  Administered 2024-02-17: 1.8 mg via INTRAVENOUS
  Administered 2024-02-17: 3.1 mg via INTRAVENOUS
  Administered 2024-02-17: 2 mg via INTRAVENOUS
  Administered 2024-02-17: 2.6 mg via INTRAVENOUS
  Administered 2024-02-18: 4 mg via INTRAVENOUS
  Administered 2024-02-18: 1.8 mg via INTRAVENOUS
  Administered 2024-02-18: 1.6 mg via INTRAVENOUS
  Administered 2024-02-18: 1.4 mg via INTRAVENOUS
  Administered 2024-02-18: 30 mg via INTRAVENOUS
  Administered 2024-02-18: 3.4 mg via INTRAVENOUS
  Administered 2024-02-19: 2 mg via INTRAVENOUS
  Administered 2024-02-19: 1.4 mg via INTRAVENOUS
  Administered 2024-02-19: 1.5 mg via INTRAVENOUS
  Administered 2024-02-20: 0.6 mg via INTRAVENOUS
  Administered 2024-02-20: 1 mg via INTRAVENOUS
  Administered 2024-02-20: 1.8 mg via INTRAVENOUS
  Administered 2024-02-20: 3.8 mg via INTRAVENOUS
  Administered 2024-02-21: 1.6 mg via INTRAVENOUS
  Administered 2024-02-21: 30 mg via INTRAVENOUS
  Filled 2024-02-16 (×2): qty 30

## 2024-02-16 NOTE — Progress Notes (Signed)
 Patient ID: Rick Jefferson, male   DOB: 11-Jul-2005, 18 y.o.   MRN: [id number] Subjective: Rick Jefferson is a 18 y.o. male with medical history significant for sickle cell disease who presented to the ER with complaints of low back pain and legs pain for the past 2 days.  The pain is not improved with home oral opioids. States Symptoms are similar to prior sickle cell pain crisis.   He continues to endorse pain of 8/10 this morning. He has no new concerns, denies flu like symptoms. No urinary symptoms  Objective:  Vital signs in last 24 hours:  Vitals:   02/16/24 0220 02/16/24 0545 02/16/24 0949 02/16/24 1308  BP: 123/75 125/67 126/82 137/76  Pulse: 75 (!) 102 77 91  Resp: 17 16 18 18   Temp: 99 F (37.2 C) (!) 100.7 F (38.2 C) 98.4 F (36.9 C) 100.2 F (37.9 C)  TempSrc:  Oral Oral Oral  SpO2: 99% 96% 99% 95%  Weight:      Height:        Intake/Output from previous day:   Intake/Output Summary (Last 24 hours) at 02/16/2024 1322 Last data filed at 02/16/2024 0515 Gross per 24 hour  Intake 1072.75 ml  Output --  Net 1072.75 ml    Physical Exam: General: Alert, awake, oriented x3, in no acute distress.  HEENT: [place]/AT [place]ER[place], EOMI Neck: Trachea midline,  no masses, no thyromegal,y no JVD, no carotid bruit OROPHARYNX:  Moist, No exudate/ erythema/lesions.  Heart: Regular rate and rhythm, without murmurs, rubs, gallops, PMI non-displaced, no heaves or thrills on palpation.  Lungs: Clear to auscultation, no wheezing or rhonchi noted. No increased vocal fremitus resonant to percussion  Abdomen: Soft, nontender, nondistended, positive bowel sounds, no masses no hepatosplenomegaly noted..  Neuro: No focal neurological deficits noted cranial nerves II through XII grossly intact. DTRs 2+ bilaterally upper and lower extremities. Strength 5 out of 5 in bilateral upper and lower extremities. Musculoskeletal:Generalize body tenderness Psychiatric: Patient alert and oriented x3, good  insight and cognition, good recent to remote recall. Lymph node survey: No cervical axillary or inguinal lymphadenopathy noted.  Lab Results:  Basic Metabolic Panel:    Component Value Date/Time   NA 138 02/16/2024 0417   K 4.0 02/16/2024 0417   CL 102 02/16/2024 0417   CO2 24 02/16/2024 0417   BUN <5 (L) 02/16/2024 0417   CREATININE 0.56 (L) 02/16/2024 0417   GLUCOSE 99 02/16/2024 0417   CALCIUM  9.7 02/16/2024 0417   CBC:    Component Value Date/Time   WBC 10.8 (H) 02/16/2024 0417   HGB 10.3 (L) 02/16/2024 0417   HCT 28.7 (L) 02/16/2024 0417   PLT 228 02/16/2024 0417   MCV 85.9 02/16/2024 0417   NEUTROABS 6.3 02/16/2024 0417   LYMPHSABS 2.6 02/16/2024 0417   MONOABS 1.6 (H) 02/16/2024 0417   EOSABS 0.1 02/16/2024 0417   BASOSABS 0.1 02/16/2024 0417    No results found for this or any previous visit (from the past 240 hours).  Studies/Results: DG Chest 2 View Result Date: 02/15/2024 CLINICAL DATA:  Fever.  Sickle cell crisis. EXAM: CHEST - 2 VIEW COMPARISON:  Radiograph 01/09/2024, CT 01/10/2024 FINDINGS: Mild cardiomegaly is stable.The cardiomediastinal contours are normal. Subsegmental atelectasis/scarring in the lung bases. Pulmonary vasculature is normal. No consolidation, pleural effusion, or pneumothorax. No acute osseous abnormalities are seen. Avascular necrosis of the right humeral head. IMPRESSION: 1. Mild cardiomegaly, unchanged. 2. Subsegmental atelectasis/scarring in the lung bases. Electronically Signed   By: Andrea Gasman  M.D.   On: 02/15/2024 18:47    Medications: Scheduled Meds:  enoxaparin  (LOVENOX ) injection  40 mg Subcutaneous Q24H   folic acid   1 mg Oral Daily   HYDROmorphone    Intravenous Q4H   Continuous Infusions:  sodium chloride  100 mL/hr at 02/16/24 0515   PRN Meds:.acetaminophen , diphenhydrAMINE , melatonin, naloxone  **AND** sodium chloride  flush, ondansetron , oxyCODONE , polyethylene  glycol  Consultants: None  Procedures: None  Antibiotics: None  Assessment/Plan: Principal Problem:   Sickle cell pain crisis (HCC) Active Problems:   Chronic pain   Anemia of chronic disease   Leukocytosis   Hb Sickle Cell Disease with Pain crisis: Continue IVF 0.45% Saline @ KVO, continue weight based Dilaudid  PCA, IV Toradol  15 mg Q 6 H for a total of 5 days, continue oral home pain medications as ordered. Monitor vitals very closely, Re-evaluate pain scale regularly, 2 L of Oxygen by [place]. Patient encouraged to ambulate on the hallway today.  Leukocytosis: slightly elevated no acute s/s of infection, will continue to monitor.   Anemia of Chronic Disease: Hgb within patients baseline, no clinical indication for for blood transfusion.  Chronic pain Syndrome: Continue oral home pain medication.   Code Status: Full Code Family Communication: N/A Disposition Plan: Not yet ready for discharge  Homer CHRISTELLA Cover NP   If 7PM-7AM, please contact night-coverage.  02/16/2024, 1:22 PM  LOS: 0 days

## 2024-02-16 NOTE — ED Notes (Signed)
"  Pt ambulated to the bathroom   "

## 2024-02-16 NOTE — Plan of Care (Signed)

## 2024-02-17 MED ORDER — KETOROLAC TROMETHAMINE 15 MG/ML IJ SOLN
15.0000 mg | Freq: Four times a day (QID) | INTRAMUSCULAR | Status: DC | PRN
  Administered 2024-02-17 – 2024-02-21 (×9): 15 mg via INTRAVENOUS
  Filled 2024-02-17 (×8): qty 1

## 2024-02-17 MED ORDER — SENNOSIDES-DOCUSATE SODIUM 8.6-50 MG PO TABS
1.0000 | ORAL_TABLET | Freq: Two times a day (BID) | ORAL | Status: DC | PRN
  Administered 2024-02-17: 1 via ORAL
  Filled 2024-02-17 (×2): qty 1

## 2024-02-17 NOTE — Progress Notes (Signed)
 Patient states pain in his legs are worse, using PCA pump consistently. PRN daily dose of oxycodone  given at 1155. Message sent to [facility] notified. Order received for IV Toradol  and as needed tylenol . Dose given to patient, right knee noted to be swollen, patient states it is hard to bend and pain upon palpation and movement. NP notified, no new orders at this time.

## 2024-02-17 NOTE — Plan of Care (Signed)
" °  Problem: Education: Goal: Awareness of infection prevention will improve Outcome: Progressing Goal: Awareness of signs and symptoms of anemia will improve Outcome: Progressing   Problem: Self-Care: Goal: Ability to incorporate actions that prevent/reduce pain crisis will improve Outcome: Progressing   Problem: Education: Goal: Knowledge of General Education information will improve Description: Including pain rating scale, medication(s)/side effects and non-pharmacologic comfort measures Outcome: Progressing   Problem: Activity: Goal: Risk for activity intolerance will decrease Outcome: Progressing   Problem: Nutrition: Goal: Adequate nutrition will be maintained Outcome: Progressing   Problem: Coping: Goal: Level of anxiety will decrease Outcome: Progressing   "

## 2024-02-17 NOTE — Plan of Care (Signed)

## 2024-02-17 NOTE — Progress Notes (Signed)
 Patient ID: Rick Jefferson, male   DOB: 2005/03/24, 18 y.o.   MRN: [id number] Subjective: Rick Jefferson is a 18 y.o. male with medical history significant for sickle cell disease who presented to the ER with complaints of low back pain and legs pain for the past 2 days.  The pain is not improved with home oral opioids. States Symptoms are similar to prior sickle cell pain crisis.    He continues to endorse pain of 6/10 this morning. Gradual improvement from yesterday.  He has no new concerns, denies, V/D, cough, headache,  flu like symptoms. No urinary symptoms  Objective:  Vital signs in last 24 hours:  Vitals:   02/17/24 0500 02/17/24 0659 02/17/24 0716 02/17/24 1201  BP:  118/71    Pulse:  96    Resp: 20 16 (!) 22 17  Temp:  99.8 F (37.7 C)    TempSrc:  Oral    SpO2: 98% 96%  98%  Weight:      Height:        Intake/Output from previous day:   Intake/Output Summary (Last 24 hours) at 02/17/2024 1214 Last data filed at 02/17/2024 0900 Gross per 24 hour  Intake 240 ml  Output --  Net 240 ml    Physical Exam: General: Alert, awake, oriented x3, in no acute distress.  HEENT: [place]/AT PEERL, EOMI Neck: Trachea midline,  no masses, no thyromegal,y no JVD, no carotid bruit OROPHARYNX:  Moist, No exudate/ erythema/lesions.  Heart: Regular rate and rhythm, without murmurs, rubs, gallops, PMI non-displaced, no heaves or thrills on palpation.  Lungs: Clear to auscultation, no wheezing or rhonchi noted. No increased vocal fremitus resonant to percussion  Abdomen: Soft, nontender, nondistended, positive bowel sounds, no masses no hepatosplenomegaly noted..  Neuro: No focal neurological deficits noted cranial nerves II through XII grossly intact. DTRs 2+ bilaterally upper and lower extremities. Strength 5 out of 5 in bilateral upper and lower extremities. Musculoskeletal: No warm swelling or erythema around joints, no spinal tenderness noted. Psychiatric: Patient alert and oriented  x3, good insight and cognition, good recent to remote recall. Lymph node survey: No cervical axillary or inguinal lymphadenopathy noted.  Lab Results:  Basic Metabolic Panel:    Component Value Date/Time   NA 138 02/16/2024 0417   K 4.0 02/16/2024 0417   CL 102 02/16/2024 0417   CO2 24 02/16/2024 0417   BUN <5 (L) 02/16/2024 0417   CREATININE 0.56 (L) 02/16/2024 0417   GLUCOSE 99 02/16/2024 0417   CALCIUM  9.7 02/16/2024 0417   CBC:    Component Value Date/Time   WBC 10.8 (H) 02/16/2024 0417   HGB 10.3 (L) 02/16/2024 0417   HCT 28.7 (L) 02/16/2024 0417   PLT 228 02/16/2024 0417   MCV 85.9 02/16/2024 0417   NEUTROABS 6.3 02/16/2024 0417   LYMPHSABS 2.6 02/16/2024 0417   MONOABS 1.6 (H) 02/16/2024 0417   EOSABS 0.1 02/16/2024 0417   BASOSABS 0.1 02/16/2024 0417    No results found for this or any previous visit (from the past 240 hours).  Studies/Results: DG Chest 2 View Result Date: 02/15/2024 CLINICAL DATA:  Fever.  Sickle cell crisis. EXAM: CHEST - 2 VIEW COMPARISON:  Radiograph 01/09/2024, CT 01/10/2024 FINDINGS: Mild cardiomegaly is stable.The cardiomediastinal contours are normal. Subsegmental atelectasis/scarring in the lung bases. Pulmonary vasculature is normal. No consolidation, pleural effusion, or pneumothorax. No acute osseous abnormalities are seen. Avascular necrosis of the right humeral head. IMPRESSION: 1. Mild cardiomegaly, unchanged. 2. Subsegmental atelectasis/scarring in the lung  bases. Electronically Signed   By: Andrea Gasman M.D.   On: 02/15/2024 18:47    Medications: Scheduled Meds:  enoxaparin  (LOVENOX ) injection  40 mg Subcutaneous Q24H   folic acid   1 mg Oral Daily   HYDROmorphone    Intravenous Q4H   hydroxyurea   1,500 mg Oral Daily   Continuous Infusions: PRN Meds:.acetaminophen , melatonin, ondansetron , oxyCODONE , polyethylene glycol  Consultants: None  Procedures: None  Antibiotics: None  Assessment/Plan: Principal Problem:    Sickle cell pain crisis (HCC) Active Problems:   Chronic pain   Anemia of chronic disease   Leukocytosis   Hb Sickle Cell Disease with Pain crisis: Continue IVF 0.45% Saline @ KVO continue weight based Dilaudid  PCA, IV Toradol  15 mg Q 6 H for a total of 5 days, continue oral home pain medications as ordered. Monitor vitals very closely, Re-evaluate pain scale regularly, 2 L of Oxygen by [place]. Patient encouraged to ambulate on the hallway today.  Leukocytosis:  slightly elevated no acute s/s of infection, will continue to monitor.   Anemia of Chronic Disease: Hgb within patients baseline, no clinical indication for for blood transfusion.  Chronic pain Syndrome: Continue oral home pain medication.    Code Status: Full Code Family Communication: N/A Disposition Plan: Not yet ready for discharge  Homer CHRISTELLA Cover NP   If 7PM-7AM, please contact night-coverage.  02/17/2024, 12:14 PM  LOS: 1 day

## 2024-02-18 ENCOUNTER — Inpatient Hospital Stay (HOSPITAL_COMMUNITY)

## 2024-02-18 DIAGNOSIS — M25561 Pain in right knee: Secondary | ICD-10-CM | POA: Insufficient documentation

## 2024-02-18 LAB — CBC
HCT: 23.8 % — ABNORMAL LOW (ref 39.0–52.0)
Hemoglobin: 8.7 g/dL — ABNORMAL LOW (ref 13.0–17.0)
MCH: 31 pg (ref 26.0–34.0)
MCHC: 36.6 g/dL — ABNORMAL HIGH (ref 30.0–36.0)
MCV: 84.7 fL (ref 80.0–100.0)
Platelets: 253 K/uL (ref 150–400)
RBC: 2.81 MIL/uL — ABNORMAL LOW (ref 4.22–5.81)
RDW: 20.2 % — ABNORMAL HIGH (ref 11.5–15.5)
WBC: 9.5 K/uL (ref 4.0–10.5)
nRBC: 0.3 % — ABNORMAL HIGH (ref 0.0–0.2)

## 2024-02-18 NOTE — Plan of Care (Signed)

## 2024-02-18 NOTE — Plan of Care (Signed)
" °  Problem: Bowel/Gastric: Goal: Gut motility will be maintained Outcome: Progressing   Problem: Tissue Perfusion: Goal: Complications related to inadequate tissue perfusion will be avoided or minimized Outcome: Progressing   Problem: Respiratory: Goal: Pulmonary complications will be avoided or minimized Outcome: Progressing Goal: Acute Chest Syndrome will be identified early to prevent complications Outcome: Progressing   Problem: Fluid Volume: Goal: Ability to maintain a balanced intake and output will improve Outcome: Progressing   Problem: Sensory: Goal: Pain level will decrease with appropriate interventions Outcome: Progressing   Problem: Health Behavior: Goal: Postive changes in compliance with treatment and prescription regimens will improve Outcome: Progressing   "

## 2024-02-18 NOTE — Progress Notes (Signed)
 Patient ID: Rick Jefferson, male   DOB: 2005-11-24, 19 y.o.   MRN: [id number] Subjective: Rick Jefferson is a 19 y.o. male with medical history significant for sickle cell disease who presented to the ER with complaints of low back pain and legs pain for the past 2 days.  The pain is not improved with home oral opioids. States Symptoms are similar to prior sickle cell pain crisis.    He continues to endorse pain of 6/10 this morning. Gradual improvement from yesterday.  He is complaining of worsening swelling and pain in his right knee. denies, V/D, cough, headache,  flu like symptoms. No urinary symptoms  Objective:  Vital signs in last 24 hours:  Vitals:   02/18/24 0526 02/18/24 0552 02/18/24 0808 02/18/24 0946  BP: 123/79   124/72  Pulse: 88   82  Resp: 15 17 16 16   Temp: 97.8 F (36.6 C)   98.2 F (36.8 C)  TempSrc: Oral   Oral  SpO2: 98%   98%  Weight:      Height:        Intake/Output from previous day:   Intake/Output Summary (Last 24 hours) at 02/18/2024 1111 Last data filed at 02/17/2024 1400 Gross per 24 hour  Intake 240 ml  Output --  Net 240 ml    Physical Exam: General: Alert, awake, oriented x3, in no acute distress.  HEENT: [place]/AT PEERL, EOMI Neck: Trachea midline,  no masses, no thyromegal,y no JVD, no carotid bruit OROPHARYNX:  Moist, No exudate/ erythema/lesions.  Heart: Regular rate and rhythm, without murmurs, rubs, gallops, PMI non-displaced, no heaves or thrills on palpation.  Lungs: Clear to auscultation, no wheezing or rhonchi noted. No increased vocal fremitus resonant to percussion  Abdomen: Soft, nontender, nondistended, positive bowel sounds, no masses no hepatosplenomegaly noted..  Neuro: No focal neurological deficits noted cranial nerves II through XII grossly intact. DTRs 2+ bilaterally upper and lower extremities. Strength 5 out of 5 in bilateral upper and lower extremities. Musculoskeletal: Swelling right knee, no spinal tenderness  noted. Psychiatric: Patient alert and oriented x3, good insight and cognition, good recent to remote recall. Lymph node survey: No cervical axillary or inguinal lymphadenopathy noted.  Lab Results:  Basic Metabolic Panel:    Component Value Date/Time   NA 138 02/16/2024 0417   K 4.0 02/16/2024 0417   CL 102 02/16/2024 0417   CO2 24 02/16/2024 0417   BUN <5 (L) 02/16/2024 0417   CREATININE 0.56 (L) 02/16/2024 0417   GLUCOSE 99 02/16/2024 0417   CALCIUM  9.7 02/16/2024 0417   CBC:    Component Value Date/Time   WBC 9.5 02/18/2024 0630   HGB 8.7 (L) 02/18/2024 0630   HCT 23.8 (L) 02/18/2024 0630   PLT 253 02/18/2024 0630   MCV 84.7 02/18/2024 0630   NEUTROABS 6.3 02/16/2024 0417   LYMPHSABS 2.6 02/16/2024 0417   MONOABS 1.6 (H) 02/16/2024 0417   EOSABS 0.1 02/16/2024 0417   BASOSABS 0.1 02/16/2024 0417    No results found for this or any previous visit (from the past 240 hours).  Studies/Results: No results found.   Medications: Scheduled Meds:  enoxaparin  (LOVENOX ) injection  40 mg Subcutaneous Q24H   folic acid   1 mg Oral Daily   HYDROmorphone    Intravenous Q4H   hydroxyurea   1,500 mg Oral Daily   Continuous Infusions: PRN Meds:.ketorolac , melatonin, ondansetron , oxyCODONE , polyethylene glycol, senna-docusate  Consultants: None  Procedures: X-ray   Antibiotics: None  Assessment/Plan: Principal Problem:   Sickle cell  pain crisis (HCC) Active Problems:   Chronic pain   Anemia of chronic disease   Leukocytosis   Right knee pain   Hb Sickle Cell Disease with Pain crisis: Continue IVF 0.45% Saline @ KVO continue weight based Dilaudid  PCA, IV Toradol  15 mg Q 6 H for a total of 5 days, continue oral home pain medications as ordered. Monitor vitals very closely, Re-evaluate pain scale regularly, 2 L of Oxygen by [place]. Patient encouraged to ambulate on the hallway today.  Right Knee pain: X-ray pending, continue pain medication, heating pad. Will continue to  monitor.  Leukocytosis: Resolved Anemia of Chronic Disease: Hgb within patients baseline, no clinical indication for for blood transfusion.  Chronic pain Syndrome: Continue oral home pain medication.    Code Status: Full Code Family Communication: N/A Disposition Plan: Not yet ready for discharge  Homer CHRISTELLA Cover NP   If 7PM-7AM, please contact night-coverage.  02/18/2024, 11:11 AM  LOS: 2 days

## 2024-02-19 LAB — CBC
HCT: 23.8 % — ABNORMAL LOW (ref 39.0–52.0)
Hemoglobin: 8.5 g/dL — ABNORMAL LOW (ref 13.0–17.0)
MCH: 30.4 pg (ref 26.0–34.0)
MCHC: 35.7 g/dL (ref 30.0–36.0)
MCV: 85 fL (ref 80.0–100.0)
Platelets: 316 K/uL (ref 150–400)
RBC: 2.8 MIL/uL — ABNORMAL LOW (ref 4.22–5.81)
RDW: 20.5 % — ABNORMAL HIGH (ref 11.5–15.5)
WBC: 10.4 K/uL (ref 4.0–10.5)
nRBC: 0.5 % — ABNORMAL HIGH (ref 0.0–0.2)

## 2024-02-19 MED ORDER — ORAL CARE MOUTH RINSE: 15.0000 mL | OROMUCOSAL | Status: DC | PRN

## 2024-02-19 NOTE — Progress Notes (Signed)
 Patient ID: Rick Jefferson, male   DOB: Jul 27, 2005, 19 y.o.   MRN: [id number] Subjective: Rick Jefferson is a 19 y.o. male with medical history significant for sickle cell disease who presented to the ER with complaints of low back pain and legs pain for the past 2 days.  The pain is not improved with home oral opioids. States Symptoms are similar to prior sickle cell pain crisis.    Patient continues to endorse pain of 6/10 today. denies, N/V/D, cough, headache, flu like symptoms. No urinary symptoms  Objective:  Vital signs in last 24 hours:  Vitals:   02/19/24 0428 02/19/24 1007 02/19/24 1200 02/19/24 1423  BP: 126/76     Pulse: 79     Resp: 18 19 18 17   Temp: 98.3 F (36.8 C)     TempSrc: Oral     SpO2: 99%  100%   Weight:      Height:        Intake/Output from previous day:  No intake or output data in the 24 hours ending 02/19/24 1630   Physical Exam: General: Alert, awake, oriented x3, in no acute distress.  HEENT: [place]/AT PEERL, EOMI Neck: Trachea midline,  no masses, no thyromegal,y no JVD, no carotid bruit OROPHARYNX:  Moist, No exudate/ erythema/lesions.  Heart: Regular rate and rhythm, without murmurs, rubs, gallops, PMI non-displaced, no heaves or thrills on palpation.  Lungs: Clear to auscultation, no wheezing or rhonchi noted. No increased vocal fremitus resonant to percussion  Abdomen: Soft, nontender, nondistended, positive bowel sounds, no masses no hepatosplenomegaly noted..  Neuro: No focal neurological deficits noted cranial nerves II through XII grossly intact. DTRs 2+ bilaterally upper and lower extremities. Strength 5 out of 5 in bilateral upper and lower extremities. Musculoskeletal: Swelling right knee, no spinal tenderness noted. Psychiatric: Patient alert and oriented x3, good insight and cognition, good recent to remote recall. Lymph node survey: No cervical axillary or inguinal lymphadenopathy noted.  Lab Results:  Basic Metabolic Panel:     Component Value Date/Time   NA 138 02/16/2024 0417   K 4.0 02/16/2024 0417   CL 102 02/16/2024 0417   CO2 24 02/16/2024 0417   BUN <5 (L) 02/16/2024 0417   CREATININE 0.56 (L) 02/16/2024 0417   GLUCOSE 99 02/16/2024 0417   CALCIUM  9.7 02/16/2024 0417   CBC:    Component Value Date/Time   WBC 10.4 02/19/2024 0718   HGB 8.5 (L) 02/19/2024 0718   HCT 23.8 (L) 02/19/2024 0718   PLT 316 02/19/2024 0718   MCV 85.0 02/19/2024 0718   NEUTROABS 6.3 02/16/2024 0417   LYMPHSABS 2.6 02/16/2024 0417   MONOABS 1.6 (H) 02/16/2024 0417   EOSABS 0.1 02/16/2024 0417   BASOSABS 0.1 02/16/2024 0417    No results found for this or any previous visit (from the past 240 hours).  Studies/Results: DG Knee 1-2 Views Right Result Date: 02/18/2024 CLINICAL DATA:  Pain and swelling of right knee. Sickle cell pain crisis. EXAM: RIGHT KNEE - 1-2 VIEW COMPARISON:  None Available. FINDINGS: Alignment and joint spaces are maintained. Subtle area of subchondral sclerosis in the medial femoral condyle. No articular collapse. No acute fracture. Question of small joint effusion. No focal soft tissue abnormalities. IMPRESSION: 1. 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 No articular collapse. 2. Question of small joint effusion. Electronically Signed   By: Andrea Gasman M.D.   On: 02/18/2024 16:32  Medications: Scheduled Meds:  enoxaparin  (LOVENOX ) injection  40 mg Subcutaneous Q24H   folic acid   1 mg Oral Daily   HYDROmorphone    Intravenous Q4H   hydroxyurea   1,500 mg Oral Daily   Continuous Infusions: PRN Meds:.ketorolac , melatonin, ondansetron , mouth rinse, oxyCODONE , polyethylene glycol, senna-docusate  Consultants: None  Procedures: X-ray ( Completed)   Antibiotics: None  Assessment/Plan: Principal Problem:   Sickle cell pain crisis (HCC) Active Problems:   Chronic pain   Anemia of chronic  disease   Leukocytosis   Right knee pain   Hb Sickle Cell Disease with Pain crisis: Continue IVF 0.45% Saline @ KVO continue weight based Dilaudid  PCA at the same dose, IV Toradol  15 mg Q 6 H for a total of 5 days, continue oral home pain medications as ordered. Monitor vitals very closely, Re-evaluate pain scale regularly, 2 L of Oxygen by [place]. Patient encouraged to ambulate on the hallway today.  Right Knee pain: Continue pain medication,heating pad. Will continue to monitor. 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 No articular collapse. Question of small joint effusion. Leukocytosis: Resolved Anemia of Chronic Disease: Hgb within patients baseline, no clinical indication for for blood transfusion.  Chronic pain Syndrome: Continue oral home pain medication.    Code Status: Full Code Family Communication: N/A Disposition Plan: Not yet ready for discharge  Homer CHRISTELLA Cover NP   If 7PM-7AM, please contact night-coverage.  02/19/2024, 4:30 PM  LOS: 3 days

## 2024-02-19 NOTE — Plan of Care (Signed)
" °  Problem: Education: Goal: Knowledge of vaso-occlusive preventative measures will improve Outcome: Progressing Goal: Awareness of infection prevention will improve Outcome: Progressing Goal: Awareness of signs and symptoms of anemia will improve Outcome: Progressing Goal: Long-term complications will improve Outcome: Progressing   Problem: Self-Care: Goal: Ability to incorporate actions that prevent/reduce pain crisis will improve Outcome: Progressing   Problem: Bowel/Gastric: Goal: Gut motility will be maintained Outcome: Progressing   Problem: Fluid Volume: Goal: Ability to maintain a balanced intake and output will improve Outcome: Progressing   Problem: Sensory: Goal: Pain level will decrease with appropriate interventions Outcome: Progressing   Problem: Clinical Measurements: Goal: Ability to maintain clinical measurements within normal limits will improve Outcome: Progressing Goal: Will remain free from infection Outcome: Progressing Goal: Diagnostic test results will improve Outcome: Progressing Goal: Respiratory complications will improve Outcome: Progressing Goal: Cardiovascular complication will be avoided Outcome: Progressing   "

## 2024-02-19 NOTE — Progress Notes (Signed)
" °   02/19/24 1133  TOC Brief Assessment  Insurance and Status Reviewed  Patient has primary care physician No (Pcp, No)  Home environment has been reviewed From home with family  Prior level of function: Independent  Prior/Current Home Services No current home services  Social Drivers of Health Review SDOH reviewed no interventions necessary  Readmission risk has been reviewed Yes  Transition of care needs no transition of care needs at this time    "

## 2024-02-20 DIAGNOSIS — D5700 Hb-SS disease with crisis, unspecified: Secondary | ICD-10-CM | POA: Diagnosis not present

## 2024-02-20 LAB — CBC
HCT: 22.6 % — ABNORMAL LOW (ref 39.0–52.0)
Hemoglobin: 8.1 g/dL — ABNORMAL LOW (ref 13.0–17.0)
MCH: 30.3 pg (ref 26.0–34.0)
MCHC: 35.8 g/dL (ref 30.0–36.0)
MCV: 84.6 fL (ref 80.0–100.0)
Platelets: 407 K/uL — ABNORMAL HIGH (ref 150–400)
RBC: 2.67 MIL/uL — ABNORMAL LOW (ref 4.22–5.81)
RDW: 20.9 % — ABNORMAL HIGH (ref 11.5–15.5)
WBC: 9.6 K/uL (ref 4.0–10.5)
nRBC: 0.4 % — ABNORMAL HIGH (ref 0.0–0.2)

## 2024-02-20 MED ORDER — OXYCODONE HCL 5 MG PO TABS
5.0000 mg | ORAL_TABLET | Freq: Four times a day (QID) | ORAL | Status: DC | PRN
  Administered 2024-02-20 – 2024-02-21 (×2): 5 mg via ORAL
  Filled 2024-02-20 (×2): qty 1

## 2024-02-20 NOTE — Progress Notes (Signed)
 Subjective  Rick Jefferson is a 19 year old male with a medical history significant for sickle cell disease, and anemia of chronic disease was admitted for sickle cell pain crisis. Patient continues to complain of pain primarily to his low back and lower extremities.  He rates pain as 6/10.  He denies any headache, chest pain, shortness of breath, urinary symptoms, nausea, vomiting, or diarrhea. Objective:  Vital signs in last 24 hours:  Vitals:   02/20/24 0209 02/20/24 0350 02/20/24 0800 02/20/24 1016  BP: 124/69   110/64  Pulse: 80   90  Resp: 18 20 19 20   Temp: 98.8 F (37.1 C)   99.1 F (37.3 C)  TempSrc: Oral   Oral  SpO2: 100% 100% 100% 100%  Weight:      Height:        Intake/Output from previous day:  No intake or output data in the 24 hours ending 02/20/24 1139  Physical Exam: General: Alert, awake, oriented x3, in no acute distress.  HEENT: [place]/AT PEERL, EOMI Neck: Trachea midline,  no masses, no thyromegal,y no JVD, no carotid bruit OROPHARYNX:  Moist, No exudate/ erythema/lesions.  Heart: Regular rate and rhythm, without murmurs, rubs, gallops, PMI non-displaced, no heaves or thrills on palpation.  Lungs: Clear to auscultation, no wheezing or rhonchi noted. No increased vocal fremitus resonant to percussion  Abdomen: Soft, nontender, nondistended, positive bowel sounds, no masses no hepatosplenomegaly noted..  Neuro: No focal neurological deficits noted cranial nerves II through XII grossly intact. DTRs 2+ bilaterally upper and lower extremities. Strength 5 out of 5 in bilateral upper and lower extremities. Musculoskeletal: No warm swelling or erythema around joints, no spinal tenderness noted. Psychiatric: Patient alert and oriented x3, good insight and cognition, good recent to remote recall. Lymph node survey: No cervical axillary or inguinal lymphadenopathy noted.  Lab Results:  Basic Metabolic Panel:    Component Value Date/Time   NA 138 02/16/2024 0417    K 4.0 02/16/2024 0417   CL 102 02/16/2024 0417   CO2 24 02/16/2024 0417   BUN <5 (L) 02/16/2024 0417   CREATININE 0.56 (L) 02/16/2024 0417   GLUCOSE 99 02/16/2024 0417   CALCIUM  9.7 02/16/2024 0417   CBC:    Component Value Date/Time   WBC 9.6 02/20/2024 0634   HGB 8.1 (L) 02/20/2024 0634   HCT 22.6 (L) 02/20/2024 0634   PLT 407 (H) 02/20/2024 0634   MCV 84.6 02/20/2024 0634   NEUTROABS 6.3 02/16/2024 0417   LYMPHSABS 2.6 02/16/2024 0417   MONOABS 1.6 (H) 02/16/2024 0417   EOSABS 0.1 02/16/2024 0417   BASOSABS 0.1 02/16/2024 0417    No results found for this or any previous visit (from the past 240 hours).  Studies/Results: No results found.  Medications: Scheduled Meds:  enoxaparin  (LOVENOX ) injection  40 mg Subcutaneous Q24H   folic acid   1 mg Oral Daily   HYDROmorphone    Intravenous Q4H   hydroxyurea   1,500 mg Oral Daily   Continuous Infusions: PRN Meds:.ketorolac , melatonin, ondansetron , mouth rinse, oxyCODONE , polyethylene glycol, senna-docusate  Consultants: None  Procedures: None  Antibiotics: None  Assessment/Plan: Principal Problem:   Sickle cell pain crisis (HCC) Active Problems:   Chronic pain   Anemia of chronic disease   Leukocytosis   Right knee pain  Sickle cell disease with pain crisis: Weaning IV dilaudid  PCA Oxycodone  5 mg every 6 hours as needed for moderate pain  Toradol  15 mg every 6 hours for a total of 5 days Continue to reevaluate  pain scale regularly  Supplemental oxygen as need Ambulate in halls in preparation for discharge  Anemia of chronic disease: Hemoglobin is stable and consistent with patient's baseline.  There is no clinical indication for blood transfusion at this time.  Continue to follow closely.  Labs in AM.  Chronic pain syndrome: Increase frequency of oxycodone  to 5 mg every 6 hours as needed.  Leukocytosis: Stable.  Follow labs in AM   Code Status: Full Code Family Communication: N/A Disposition  Plan: Not yet ready for discharge.  Discharge planned for 02/21/2024  Bertram Georgina Bald  DNP, APRN, FNP-C [facility] [address] [place], [place] [address] [phone]  If 7PM-7AM, please contact night-coverage.  02/20/2024, 11:39 AM  LOS: 4 days

## 2024-02-20 NOTE — Plan of Care (Signed)
  Problem: Activity: Goal: Risk for activity intolerance will decrease Outcome: Progressing   Problem: Nutrition: Goal: Adequate nutrition will be maintained Outcome: Progressing   Problem: Coping: Goal: Level of anxiety will decrease Outcome: Progressing   Problem: Pain Managment: Goal: General experience of comfort will improve and/or be controlled Outcome: Progressing

## 2024-02-20 NOTE — Plan of Care (Signed)
" °  Problem: Education: Goal: Knowledge of vaso-occlusive preventative measures will improve Outcome: Progressing Goal: Awareness of infection prevention will improve Outcome: Progressing Goal: Awareness of signs and symptoms of anemia will improve Outcome: Progressing Goal: Long-term complications will improve Outcome: Progressing   Problem: Self-Care: Goal: Ability to incorporate actions that prevent/reduce pain crisis will improve Outcome: Progressing   Problem: Bowel/Gastric: Goal: Gut motility will be maintained Outcome: Progressing   Problem: Respiratory: Goal: Pulmonary complications will be avoided or minimized Outcome: Progressing Goal: Acute Chest Syndrome will be identified early to prevent complications Outcome: Progressing   Problem: Fluid Volume: Goal: Ability to maintain a balanced intake and output will improve Outcome: Progressing   Problem: Health Behavior: Goal: Postive changes in compliance with treatment and prescription regimens will improve Outcome: Progressing   "

## 2024-02-21 DIAGNOSIS — D5700 Hb-SS disease with crisis, unspecified: Secondary | ICD-10-CM | POA: Diagnosis not present

## 2024-02-21 LAB — CBC WITH DIFFERENTIAL/PLATELET
Abs Immature Granulocytes: 0.08 K/uL — ABNORMAL HIGH (ref 0.00–0.07)
Basophils Absolute: 0.1 K/uL (ref 0.0–0.1)
Basophils Relative: 1 %
Eosinophils Absolute: 0.2 K/uL (ref 0.0–0.5)
Eosinophils Relative: 2 %
HCT: 23.1 % — ABNORMAL LOW (ref 39.0–52.0)
Hemoglobin: 8.2 g/dL — ABNORMAL LOW (ref 13.0–17.0)
Immature Granulocytes: 1 %
Lymphocytes Relative: 18 %
Lymphs Abs: 2 K/uL (ref 0.7–4.0)
MCH: 30 pg (ref 26.0–34.0)
MCHC: 35.5 g/dL (ref 30.0–36.0)
MCV: 84.6 fL (ref 80.0–100.0)
Monocytes Absolute: 0.9 K/uL (ref 0.1–1.0)
Monocytes Relative: 8 %
Neutro Abs: 7.8 K/uL — ABNORMAL HIGH (ref 1.7–7.7)
Neutrophils Relative %: 70 %
Platelets: 485 K/uL — ABNORMAL HIGH (ref 150–400)
RBC: 2.73 MIL/uL — ABNORMAL LOW (ref 4.22–5.81)
RDW: 21.3 % — ABNORMAL HIGH (ref 11.5–15.5)
Smear Review: NORMAL
WBC: 11.1 K/uL — ABNORMAL HIGH (ref 4.0–10.5)
nRBC: 0.4 % — ABNORMAL HIGH (ref 0.0–0.2)

## 2024-02-21 LAB — COMPREHENSIVE METABOLIC PANEL WITH GFR
ALT: 34 U/L (ref 0–44)
AST: 45 U/L — ABNORMAL HIGH (ref 15–41)
Albumin: 4.3 g/dL (ref 3.5–5.0)
Alkaline Phosphatase: 217 U/L — ABNORMAL HIGH (ref 38–126)
Anion gap: 12 (ref 5–15)
BUN: 8 mg/dL (ref 6–20)
CO2: 25 mmol/L (ref 22–32)
Calcium: 9.7 mg/dL (ref 8.9–10.3)
Chloride: 102 mmol/L (ref 98–111)
Creatinine, Ser: 0.55 mg/dL — ABNORMAL LOW (ref 0.61–1.24)
GFR, Estimated: >60 mL/min
Glucose, Bld: 83 mg/dL (ref 70–99)
Potassium: 4.6 mmol/L (ref 3.5–5.1)
Sodium: 139 mmol/L (ref 135–145)
Total Bilirubin: 3.1 mg/dL — ABNORMAL HIGH (ref 0.0–1.2)
Total Protein: 8 g/dL (ref 6.5–8.1)

## 2024-02-21 MED ORDER — IBUPROFEN 600 MG PO TABS: 600.0000 mg | ORAL_TABLET | Freq: Four times a day (QID) | ORAL | 0 refills | Status: AC | PRN

## 2024-02-21 MED ORDER — OXYCODONE HCL 5 MG PO TABS: 5.0000 mg | ORAL_TABLET | Freq: Four times a day (QID) | ORAL | 0 refills | Status: AC | PRN

## 2024-02-21 NOTE — Plan of Care (Signed)
" °  Problem: Self-Care: Goal: Ability to incorporate actions that prevent/reduce pain crisis will improve Outcome: Progressing   Problem: Education: Goal: Knowledge of General Education information will improve Description: Including pain rating scale, medication(s)/side effects and non-pharmacologic comfort measures Outcome: Progressing   Problem: Activity: Goal: Risk for activity intolerance will decrease Outcome: Progressing   Problem: Nutrition: Goal: Adequate nutrition will be maintained Outcome: Progressing   "

## 2024-02-21 NOTE — Progress Notes (Signed)
 Reviewed discharge paperwork and instructions with patient. Advised of where to pick up discharge medications. Time provided for patient to ask questions. Patient denied having questions. Discharged via wheelchair by NT.

## 2024-02-21 NOTE — Discharge Summary (Signed)
 Physician Discharge Summary  Rick Jefferson FMW:[id number] DOB: 2006/02/11 DOA: 02/15/2024  PCP: Pcp, No  Admit date: 02/15/2024  Discharge date: 02/21/2024  Discharge Diagnoses:  Principal Problem:   Sickle cell pain crisis (HCC) Active Problems:   Chronic pain   Anemia of chronic disease   Leukocytosis   Right knee pain   Discharge Condition: Stable  Disposition:   Follow-up Information     Paseda, Folashade R, FNP Follow up in 1 week(s).   Specialty: Nurse Practitioner Contact information: [address] [place] [address] [place], [place] [address] [phone]                Pt is discharged home in good condition and is to follow up with Pcp, No this week to have labs evaluated. Rick Jefferson is instructed to increase activity slowly and balance with rest for the next few days, and use prescribed medication to complete treatment of pain  Diet: Regular Wt Readings from Last 3 Encounters:  02/15/24 66.7 kg (38%, Z= -0.31)*  02/14/24 69.9 kg (50%, Z= 0.00)*  01/09/24 69.9 kg (50%, Z= 0.01)*   * Growth percentiles are based on CDC (Boys, 2-20 Years) data.    History of present illness:  Rick Jefferson is a 19 year old male with a medical history significant for sickle cell disease that presented to the emergency room with complaints of pain to low back and legs for 2 days prior to presenting.  Patient's pain was not improved with home oral opiates.  States symptoms are similar to prior sickle cell crisis.  Presented to the ER 1 day prior for the same.  He was treated with IV opiate-based analgesics and IV fluids.  He felt better and went home.  At home, his pain recurred and was not controlled with his home oxycodone .  No subjective fevers or chills.  No chest pain. In the ER, required multiple rounds of IV opiate-based analgesics, but still in pain.  Chest x-ray nonacute. Patient received IV Dilaudid  1 mg x 4, IV Benadryl  12.5 mg x 1, IV Toradol  15 mg x 1, and IV  fluids.  Due to persistent symptomatology, EDP requested admission for further management of sickle cell pain crisis.  Admitted by [facility], hospitalist service.  ER course: Temperature 99.1 F.  BP 122/90, pulse 108, respiratory rate 16, oxygen saturation 94% on room air.  Hospital Course:  Patient was admitted for sickle cell pain crisis and managed appropriately with IVF, IV Dilaudid  via PCA and IV Toradol , as well as other adjunct therapies per sickle cell pain management protocols.  IV Dilaudid  PCA weaned appropriately and home medications restarted. Rick Jefferson states that he can manage at home, however he does not have any home medications.  Reviewed PDMP, oxycodone  5 mg every 6 hours #30 was sent to patient's pharmacy.  There were no inconsistencies noted on PDMP. Also, ibuprofen  600 mg every 8 hours as needed for mild to moderate pain. He states that he has hydroxyurea  and folic acid  at home.  He states that he has been unable to follow-up with PCP due to transportation constraints.  Patient provided information for [facility] for transportation needs. Also, given written information to establish care with a primary care provider.  He no longer has a pediatrician.  Rick Jefferson expressed understanding.  Rick Jefferson is alert, oriented, and ambulating without assistance.  Vital signs have remained stable.  Patient was therefore discharged home today in a hemodynamically stable condition.   Rick Jefferson will follow-up with  PCP within 1 week of this discharge. Rick Jefferson was counseled extensively about nonpharmacologic means of pain management, patient verbalized understanding and was appreciative of  the care received during this admission.   We discussed the need for good hydration, monitoring of hydration status, avoidance of heat, cold, stress, and infection triggers. We discussed the need to be adherent with taking Hydrea  and other home medications. Patient was reminded of the need  to seek medical attention immediately if any symptom of bleeding, anemia, or infection occurs.  Discharge Exam: Vitals:   02/21/24 0727 02/21/24 1003  BP:  117/75  Pulse:  91  Resp: 20 (!) 22  Temp:  98.4 F (36.9 C)  SpO2:  96%   Vitals:   02/21/24 0440 02/21/24 0529 02/21/24 0727 02/21/24 1003  BP: 112/63   117/75  Pulse: 94   91  Resp: 18 13 20  (!) 22  Temp: 99.3 F (37.4 C)   98.4 F (36.9 C)  TempSrc: Oral   Oral  SpO2: 98% 98%  96%  Weight:      Height:        General appearance : Awake, alert, not in any distress. Speech Clear. Not toxic looking HEENT: Atraumatic and Normocephalic, pupils equally reactive to light and accomodation Neck: Supple, no JVD. No cervical lymphadenopathy.  Chest: Good air entry bilaterally, no added sounds  CVS: S1 S2 regular, no murmurs.  Abdomen: Bowel sounds present, Non tender and not distended with no gaurding, rigidity or rebound. Extremities: B/L Lower Ext shows no edema, both legs are warm to touch Neurology: Awake alert, and oriented X 3, CN II-XII intact, Non focal Skin: No Rash  Discharge Instructions   Allergies as of 02/21/2024   No Known Allergies      Medication List     TAKE these medications    folic acid  1 MG tablet Commonly known as: FOLVITE  Take 1 mg by mouth daily.   hydroxyurea  500 MG capsule Commonly known as: HYDREA  Take 1,500 mg by mouth daily.   ibuprofen  600 MG tablet Commonly known as: ADVIL  Take 1 tablet (600 mg total) by mouth every 6 (six) hours as needed. What changed:  when to take this reasons to take this   oxyCODONE  5 MG immediate release tablet Commonly known as: Oxy IR/ROXICODONE  Take 1 tablet (5 mg total) by mouth every 6 (six) hours as needed for severe pain (pain score 7-10) or moderate pain (pain score 4-6). What changed: when to take this   polyethylene glycol 17 g packet Commonly known as: MIRALAX  / GLYCOLAX  Take 17 g by mouth 2 (two) times daily. What changed:  when to  take this reasons to take this   Tylenol  325 MG Caps Generic drug: Acetaminophen  Take 650 mg by mouth every 6 (six) hours as needed (pain). What changed: when to take this        The results of significant diagnostics from this hospitalization (including imaging, microbiology, ancillary and laboratory) are listed below for reference.    Significant Diagnostic Studies: DG Knee 1-2 Views Right Result Date: 02/18/2024 CLINICAL DATA:  Pain and swelling of right knee. Sickle cell pain crisis. EXAM: RIGHT KNEE - 1-2 VIEW COMPARISON:  None Available. FINDINGS: Alignment and joint spaces are maintained. Subtle area of subchondral sclerosis in the medial femoral condyle. No articular collapse. No acute fracture. Question of small joint effusion. No focal soft tissue abnormalities. IMPRESSION: 1. Subtle area of subchondral sclerosis in the medial femoral condyle, suspicious for osteonecrosis in the  setting of sickle cell disease. No articular collapse. 2. Question of small joint effusion. Electronically Signed   By: Andrea Gasman M.D.   On: 02/18/2024 16:32   DG Chest 2 View Result Date: 02/15/2024 CLINICAL DATA:  Fever.  Sickle cell crisis. EXAM: CHEST - 2 VIEW COMPARISON:  Radiograph 01/09/2024, CT 01/10/2024 FINDINGS: Mild cardiomegaly is stable.The cardiomediastinal contours are normal. Subsegmental atelectasis/scarring in the lung bases. Pulmonary vasculature is normal. No consolidation, pleural effusion, or pneumothorax. No acute osseous abnormalities are seen. Avascular necrosis of the right humeral head. IMPRESSION: 1. Mild cardiomegaly, unchanged. 2. Subsegmental atelectasis/scarring in the lung bases. Electronically Signed   By: Andrea Gasman M.D.   On: 02/15/2024 18:47    Microbiology: No results found for this or any previous visit (from the past 240 hours).   Labs: Basic Metabolic Panel: Recent Labs  Lab 02/15/24 1702 02/16/24 0417 02/21/24 0625  NA 136 138 139  K 4.0 4.0  4.6  CL 102 102 102  CO2 21* 24 25  GLUCOSE 91 99 83  BUN 6 <5* 8  CREATININE 0.55* 0.56* 0.55*  CALCIUM  9.6 9.7 9.7  MG  --  1.9  --   PHOS  --  4.0  --    Liver Function Tests: Recent Labs  Lab 02/16/24 0417 02/21/24 0625  AST 90* 45*  ALT 45* 34  ALKPHOS 172* 217*  BILITOT 6.2* 3.1*  PROT 7.9 8.0  ALBUMIN 4.8 4.3   No results for input(s): LIPASE, AMYLASE in the last 168 hours. No results for input(s): AMMONIA in the last 168 hours. CBC: Recent Labs  Lab 02/15/24 1702 02/16/24 0417 02/18/24 0630 02/19/24 0718 02/20/24 0634 02/21/24 0625  WBC 12.2* 10.8* 9.5 10.4 9.6 11.1*  NEUTROABS 8.9* 6.3  --   --   --  7.8*  HGB 10.5* 10.3* 8.7* 8.5* 8.1* 8.2*  HCT 29.3* 28.7* 23.8* 23.8* 22.6* 23.1*  MCV 86.4 85.9 84.7 85.0 84.6 84.6  PLT 235 228 253 316 407* 485*   Cardiac Enzymes: No results for input(s): CKTOTAL, CKMB, CKMBINDEX, TROPONINI in the last 168 hours. BNP: Invalid input(s): POCBNP CBG: No results for input(s): GLUCAP in the last 168 hours.  Time coordinating discharge: 50 minutes  Signed: Bertram Georgina Bald  DNP, APRN, FNP-C [facility] [address] [place], [place] [address] [phone]  [facility] 02/21/2024, 11:40 AM

## 2024-02-21 NOTE — Discharge Instructions (Signed)
[facility] [address] [place], [place]  [phone]  

## 2024-02-22 ENCOUNTER — Telehealth: Payer: Self-pay

## 2024-02-22 NOTE — Telephone Encounter (Signed)
 Copied from CRM [id number] [id number]. Topic: Appointments - Scheduling Inquiry for Clinic >> Feb 22, 2024  2:34 PM Darshell M wrote: Reason for CRM: Hospital follow up. Patient scheduled  04/13/2024 first available but outside 14-day window.  Please advise if we can schedule sooner. Kh

## 2024-02-25 ENCOUNTER — Encounter: Payer: Self-pay | Admitting: Nurse Practitioner

## 2024-02-25 ENCOUNTER — Ambulatory Visit (INDEPENDENT_AMBULATORY_CARE_PROVIDER_SITE_OTHER): Payer: Self-pay | Admitting: Nurse Practitioner

## 2024-02-25 VITALS — BP 119/70 | HR 64 | Temp 96.9°F | Wt 144.0 lb

## 2024-02-25 DIAGNOSIS — D571 Sickle-cell disease without crisis: Secondary | ICD-10-CM

## 2024-02-25 NOTE — Progress Notes (Signed)
 "  Subjective   Patient ID: Rick Jefferson, male    DOB: [birth year], 19 y.o.   MRN: [id number]  Chief Complaint  Patient presents with   Hospitalization Follow-up    SCC crisis. Feeling a lot better.     Referring provider: No ref. provider found  Rick Jefferson is a 19 y.o. male with Past Medical History: No date: Asthma No date: Sickle cell anemia (HCC)   HPI  Patient presents today for a hospital follow-up.  He was recently admitted for sickle cell crisis.  Is a patient with hematology through [facility] and does have a follow-up appointment scheduled in February.  Overall doing well since hospital discharge. Denies f/c/s, n/v/d, hemoptysis, PND, leg swelling Denies chest pain or edema     Allergies[1]  Immunization History  Administered Date(s) Administered   Influenza,inj,Quad PF,6+ Mos 11/16/2018   PFIZER(Purple Top)SARS-COV-2 Vaccination 07/20/2019, 08/10/2019    Tobacco History: Tobacco Use History[2] Counseling given: Not Answered Tobacco comments: Grandma smokes outside   Outpatient Encounter Medications as of 02/25/2024  Medication Sig   Acetaminophen  (TYLENOL ) 325 MG CAPS Take 650 mg by mouth every 6 (six) hours as needed (pain).   folic acid  (FOLVITE ) 1 MG tablet Take 1 mg by mouth daily.   hydroxyurea  (HYDREA ) 500 MG capsule Take 1,500 mg by mouth daily.   ibuprofen  (ADVIL ) 600 MG tablet Take 1 tablet (600 mg total) by mouth every 6 (six) hours as needed.   oxyCODONE  (OXY IR/ROXICODONE ) 5 MG immediate release tablet Take 1 tablet (5 mg total) by mouth every 6 (six) hours as needed for severe pain (pain score 7-10) or moderate pain (pain score 4-6).   polyethylene glycol (MIRALAX  / GLYCOLAX ) 17 g packet Take 17 g by mouth 2 (two) times daily.   No facility-administered encounter medications on file as of 02/25/2024.    Review of Systems  Review of Systems  Constitutional: Negative.   HENT: Negative.    Cardiovascular: Negative.    Gastrointestinal: Negative.   Allergic/Immunologic: Negative.   Neurological: Negative.   Psychiatric/Behavioral: Negative.       Objective:   BP 119/70   Pulse 64   Temp (!) 96.9 F (36.1 C) (Temporal)   Wt 144 lb (65.3 kg)   SpO2 95%   BMI 22.55 kg/m   Wt Readings from Last 5 Encounters:  02/25/24 144 lb (65.3 kg) (33%, Z= -0.45)*  02/15/24 147 lb (66.7 kg) (38%, Z= -0.31)*  02/14/24 154 lb 1.6 oz (69.9 kg) (50%, Z= 0.00)*  01/09/24 154 lb (69.9 kg) (50%, Z= 0.01)*  09/06/23 149 lb (67.6 kg) (44%, Z= -0.15)*   * Growth percentiles are based on CDC (Boys, 2-20 Years) data.     Physical Exam Vitals and nursing note reviewed.  Constitutional:      General: He is not in acute distress.    Appearance: He is well-developed.  Cardiovascular:     Rate and Rhythm: Normal rate and regular rhythm.  Pulmonary:     Effort: Pulmonary effort is normal.     Breath sounds: Normal breath sounds.  Skin:    General: Skin is warm and dry.  Neurological:     Mental Status: He is alert and oriented to person, place, and time.       Assessment & Plan:   Sickle cell disease without crisis (HCC) -     Sickle Cell Panel     No follow-ups on file.   Bascom GORMAN Borer, NP 02/25/2024     [  1] No Known Allergies [2]  Social History Tobacco Use  Smoking Status Never   Passive exposure: Yes  Smokeless Tobacco Never  Tobacco Comments   Grandma smokes outside   "

## 2024-02-26 ENCOUNTER — Ambulatory Visit: Payer: Self-pay | Admitting: *Deleted

## 2024-02-26 LAB — CMP14+CBC/D/PLT+FER+RETIC+V...
ALT: 17 IU/L (ref 0–44)
AST: 19 IU/L (ref 0–40)
Albumin: 4.6 g/dL (ref 4.3–5.2)
Alkaline Phosphatase: 170 IU/L — ABNORMAL HIGH (ref 51–125)
BUN/Creatinine Ratio: 8 — ABNORMAL LOW (ref 9–20)
BUN: 5 mg/dL — ABNORMAL LOW (ref 6–20)
Basophils Absolute: 0.2 x10E3/uL (ref 0.0–0.2)
Basos: 2 %
Bilirubin Total: 2.2 mg/dL — ABNORMAL HIGH (ref 0.0–1.2)
CO2: 22 mmol/L (ref 20–29)
Calcium: 9.5 mg/dL (ref 8.7–10.2)
Chloride: 102 mmol/L (ref 96–106)
Creatinine, Ser: 0.62 mg/dL — ABNORMAL LOW (ref 0.76–1.27)
EOS (ABSOLUTE): 0.1 x10E3/uL (ref 0.0–0.4)
Eos: 1 %
Ferritin: 368 ng/mL — ABNORMAL HIGH (ref 16–124)
Globulin, Total: 3 g/dL (ref 1.5–4.5)
Glucose: 90 mg/dL (ref 70–99)
Hematocrit: 27.3 % — ABNORMAL LOW (ref 37.5–51.0)
Hemoglobin: 8.7 g/dL — ABNORMAL LOW (ref 13.0–17.7)
Immature Grans (Abs): 0.1 x10E3/uL (ref 0.0–0.1)
Immature Granulocytes: 1 %
Lymphocytes Absolute: 3.3 x10E3/uL — ABNORMAL HIGH (ref 0.7–3.1)
Lymphs: 43 %
MCH: 30 pg (ref 26.6–33.0)
MCHC: 31.9 g/dL (ref 31.5–35.7)
MCV: 94 fL (ref 79–97)
Monocytes Absolute: 0.6 x10E3/uL (ref 0.1–0.9)
Monocytes: 7 %
NRBC: 3 % — ABNORMAL HIGH (ref 0–0)
Neutrophils Absolute: 3.5 x10E3/uL (ref 1.4–7.0)
Neutrophils: 46 %
Platelets: 1179 x10E3/uL (ref 150–450)
Potassium: 4.7 mmol/L (ref 3.5–5.2)
RBC: 2.9 x10E6/uL — ABNORMAL LOW (ref 4.14–5.80)
RDW: 20.6 % — ABNORMAL HIGH (ref 11.6–15.4)
Retic Ct Pct: 10.8 % — ABNORMAL HIGH (ref 0.6–2.6)
Sodium: 141 mmol/L (ref 134–144)
Total Protein: 7.6 g/dL (ref 6.0–8.5)
Vit D, 25-Hydroxy: 5.3 ng/mL — ABNORMAL LOW (ref 30.0–100.0)
WBC: 7.7 x10E3/uL (ref 3.4–10.8)
eGFR: 141 mL/min/1.73

## 2024-02-26 NOTE — Telephone Encounter (Signed)
 FYI Only or Action Required?: Action required by provider: lab or test result follow-up needed.  Patient was last seen in primary care on 02/25/2024 by Oley Bascom RAMAN, NP.  Called Nurse Triage reporting Results.  Symptoms began na.  Interventions attempted: Other: na.  Symptoms are: na.  Triage Disposition: Information or Advice Only Call  Patient/caregiver understands and will follow disposition?: Yes  Agent unable to get office nurse on the line- meeting- will take call and report information. Critical lab results:  Platelets 1,179   Copied from CRM #[id number]. Topic: Clinical - Lab/Test Results >> Feb 26, 2024 10:30 AM Montie POUR wrote: Reason for CRM:  Critical lab from [employer] [employer]. I transferred to clinic and Macario and all the nurses was in a meeting. Darice hung up so I called her back and got in touch with Belinda. [phone]. Reason for Disposition  [1] Follow-up call to recent contact AND [2] information only call, no triage required  Answer Assessment - Initial Assessment Questions 1. REASON FOR CALL: What is the main reason for your call? or How can I best help you?     [facility]: Calling to report critical lab-  Platelets 1,179 Results are visible in lab results I chart- will alert office with high priority note.  Protocols used: Information Only Call - No Triage-A-AH

## 2024-02-29 ENCOUNTER — Ambulatory Visit: Payer: Self-pay | Admitting: Nurse Practitioner

## 2024-02-29 MED ORDER — VITAMIN D (ERGOCALCIFEROL) 1.25 MG (50000 UNIT) PO CAPS: 50000.0000 [IU] | ORAL_CAPSULE | ORAL | 2 refills | Status: AC

## 2024-02-29 MED ORDER — ASPIRIN 81 MG PO TBEC: 81.0000 mg | DELAYED_RELEASE_TABLET | Freq: Every day | ORAL | Status: AC

## 2024-03-01 NOTE — Telephone Encounter (Signed)
 Pt was called and advised. Please place future labs if you have not done so. Thanks [facility]

## 2024-03-16 ENCOUNTER — Other Ambulatory Visit: Payer: Self-pay

## 2024-03-16 ENCOUNTER — Other Ambulatory Visit: Payer: Self-pay | Admitting: Nurse Practitioner

## 2024-03-16 DIAGNOSIS — D571 Sickle-cell disease without crisis: Secondary | ICD-10-CM

## 2024-03-17 ENCOUNTER — Ambulatory Visit: Payer: Self-pay | Admitting: Nurse Practitioner

## 2024-03-17 LAB — CMP14+CBC/D/PLT+FER+RETIC+V...
ALT: 8 [IU]/L (ref 0–44)
AST: 20 [IU]/L (ref 0–40)
Albumin: 4.6 g/dL (ref 4.3–5.2)
Alkaline Phosphatase: 119 [IU]/L (ref 51–125)
BUN/Creatinine Ratio: 10 (ref 9–20)
BUN: 6 mg/dL (ref 6–20)
Basophils Absolute: 0.1 10*3/uL (ref 0.0–0.2)
Basos: 2 %
Bilirubin Total: 3.4 mg/dL — ABNORMAL HIGH (ref 0.0–1.2)
CO2: 19 mmol/L — ABNORMAL LOW (ref 20–29)
Calcium: 9.5 mg/dL (ref 8.7–10.2)
Chloride: 107 mmol/L — ABNORMAL HIGH (ref 96–106)
Creatinine, Ser: 0.61 mg/dL — ABNORMAL LOW (ref 0.76–1.27)
EOS (ABSOLUTE): 0.2 10*3/uL (ref 0.0–0.4)
Eos: 3 %
Ferritin: 157 ng/mL — ABNORMAL HIGH (ref 16–124)
Globulin, Total: 2.7 g/dL (ref 1.5–4.5)
Glucose: 88 mg/dL (ref 70–99)
Hematocrit: 28 % — ABNORMAL LOW (ref 37.5–51.0)
Hemoglobin: 9.3 g/dL — ABNORMAL LOW (ref 13.0–17.7)
Immature Grans (Abs): 0.1 10*3/uL (ref 0.0–0.1)
Immature Granulocytes: 1 %
Lymphocytes Absolute: 3.6 10*3/uL — ABNORMAL HIGH (ref 0.7–3.1)
Lymphs: 50 %
MCH: 30 pg (ref 26.6–33.0)
MCHC: 33.2 g/dL (ref 31.5–35.7)
MCV: 90 fL (ref 79–97)
Monocytes Absolute: 1 10*3/uL — ABNORMAL HIGH (ref 0.1–0.9)
Monocytes: 14 %
NRBC: 1 % — ABNORMAL HIGH (ref 0–0)
Neutrophils Absolute: 2.2 10*3/uL (ref 1.4–7.0)
Neutrophils: 30 %
Platelets: 384 10*3/uL (ref 150–450)
Potassium: 4.2 mmol/L (ref 3.5–5.2)
RBC: 3.1 x10E6/uL — ABNORMAL LOW (ref 4.14–5.80)
RDW: 22.2 % — ABNORMAL HIGH (ref 11.6–15.4)
Retic Ct Pct: 12.1 % — ABNORMAL HIGH (ref 0.6–2.6)
Sodium: 141 mmol/L (ref 134–144)
Total Protein: 7.3 g/dL (ref 6.0–8.5)
Vit D, 25-Hydroxy: 23.3 ng/mL — ABNORMAL LOW (ref 30.0–100.0)
WBC: 7.1 10*3/uL (ref 3.4–10.8)
eGFR: 142 mL/min/{1.73_m2}

## 2024-04-13 ENCOUNTER — Ambulatory Visit: Payer: Self-pay | Admitting: Nurse Practitioner
